# Patient Record
Sex: Female | Born: 1955 | Race: White | Hispanic: No | Smoking: Never smoker
Health system: Southern US, Community
[De-identification: ages and names within clinical notes are randomized; demographics above are authoritative.]

## PROBLEM LIST (undated history)

## (undated) DIAGNOSIS — I341 Nonrheumatic mitral (valve) prolapse: Secondary | ICD-10-CM

## (undated) DIAGNOSIS — I10 Essential (primary) hypertension: Secondary | ICD-10-CM

## (undated) HISTORY — DX: Nonrheumatic mitral (valve) prolapse: I34.1

---

## 1998-02-20 HISTORY — PX: BREAST ENHANCEMENT SURGERY: SHX7

## 1999-10-31 ENCOUNTER — Encounter: Payer: Self-pay | Admitting: Family Medicine

## 1999-10-31 ENCOUNTER — Encounter: Admission: RE | Admit: 1999-10-31 | Discharge: 1999-10-31 | Payer: Self-pay | Admitting: Family Medicine

## 2001-04-18 ENCOUNTER — Encounter: Payer: Self-pay | Admitting: *Deleted

## 2001-04-18 ENCOUNTER — Encounter: Admission: RE | Admit: 2001-04-18 | Discharge: 2001-04-18 | Payer: Self-pay | Admitting: *Deleted

## 2001-10-02 ENCOUNTER — Encounter: Payer: Self-pay | Admitting: Family Medicine

## 2001-10-02 ENCOUNTER — Encounter: Admission: RE | Admit: 2001-10-02 | Discharge: 2001-10-02 | Payer: Self-pay | Admitting: Family Medicine

## 2001-11-01 ENCOUNTER — Other Ambulatory Visit: Admission: RE | Admit: 2001-11-01 | Discharge: 2001-11-01 | Payer: Self-pay | Admitting: Obstetrics and Gynecology

## 2005-06-01 ENCOUNTER — Encounter: Admission: RE | Admit: 2005-06-01 | Discharge: 2005-06-01 | Payer: Self-pay | Admitting: Family Medicine

## 2005-06-07 ENCOUNTER — Encounter: Admission: RE | Admit: 2005-06-07 | Discharge: 2005-06-07 | Payer: Self-pay | Admitting: Family Medicine

## 2006-02-07 ENCOUNTER — Other Ambulatory Visit: Admission: RE | Admit: 2006-02-07 | Discharge: 2006-02-07 | Payer: Self-pay | Admitting: Family Medicine

## 2006-04-27 ENCOUNTER — Other Ambulatory Visit: Admission: RE | Admit: 2006-04-27 | Discharge: 2006-04-27 | Payer: Self-pay | Admitting: Obstetrics & Gynecology

## 2006-07-05 ENCOUNTER — Encounter: Admission: RE | Admit: 2006-07-05 | Discharge: 2006-07-05 | Payer: Self-pay | Admitting: Obstetrics & Gynecology

## 2008-03-13 ENCOUNTER — Encounter: Admission: RE | Admit: 2008-03-13 | Discharge: 2008-03-13 | Payer: Self-pay | Admitting: Obstetrics & Gynecology

## 2009-04-19 ENCOUNTER — Encounter: Admission: RE | Admit: 2009-04-19 | Discharge: 2009-04-19 | Payer: Self-pay | Admitting: Obstetrics & Gynecology

## 2011-06-08 ENCOUNTER — Ambulatory Visit: Payer: Self-pay | Admitting: Psychiatry

## 2012-10-31 ENCOUNTER — Ambulatory Visit: Payer: Self-pay | Admitting: Nurse Practitioner

## 2012-10-31 ENCOUNTER — Encounter: Payer: Self-pay | Admitting: Certified Nurse Midwife

## 2012-11-01 ENCOUNTER — Encounter: Payer: Self-pay | Admitting: Certified Nurse Midwife

## 2012-11-01 ENCOUNTER — Ambulatory Visit (INDEPENDENT_AMBULATORY_CARE_PROVIDER_SITE_OTHER): Payer: Self-pay | Admitting: Certified Nurse Midwife

## 2012-11-01 VITALS — BP 100/60 | HR 60 | Resp 16 | Ht 65.75 in | Wt 159.0 lb

## 2012-11-01 DIAGNOSIS — N760 Acute vaginitis: Secondary | ICD-10-CM

## 2012-11-01 DIAGNOSIS — B373 Candidiasis of vulva and vagina: Secondary | ICD-10-CM

## 2012-11-01 MED ORDER — FLUCONAZOLE 150 MG PO TABS: 150.0000 mg | ORAL_TABLET | Freq: Once | ORAL | Status: DC

## 2012-11-01 MED ORDER — METRONIDAZOLE 0.75 % VA GEL: 1.0000 | Freq: Every day | VAGINAL | Status: DC

## 2012-11-01 NOTE — Progress Notes (Signed)
57 y.o.Divorced Caucasian female G0P0000 with a 3 day(s) history of the following:burning, local irritation, odor and vulvar itching Sexually active: yes Last sexual activity:10days ago. Pt also reports the following associated symptoms: none Patient has tried over the counter treatment with no relief. Denies new personal products other than some new tights. Works out at gym, but changes clothes frequently after work out. No STD concerns. Patient menopausal on HRT, denies vaginal bleeding or vaginal dryness  O: Healthy female WDWN Affect: normal, orientation x 3    Exam:  UXL:[id number], Urethra, Skene's normal, slightly increased pink in color, no scaling or exudate or lesions                Vag:no lesions, discharge: copious, yellow, watery and slight odor, pH 5.0, wet prep done                Cx:  normal appearance and non tender                Uterus:Mid position , normal, non tender                Adnexa: normal adnexa and no mass, fullness, tenderness  Wet Prep shows:Positive for yeast and BV, negative for trich   A: BV Yeast vaginitis  P: Reviewed findings. Discussed aveeno sitz bath use for comfort. Avoid new clothing with out washing first. Rx Diflucan see order Rx Metrogel see order  RV prn

## 2012-11-03 NOTE — Progress Notes (Signed)
Note reviewed, agree with plan.  Alencia Gordon, MD  

## 2012-11-06 ENCOUNTER — Telehealth: Payer: Self-pay | Admitting: Certified Nurse Midwife

## 2012-11-06 NOTE — Telephone Encounter (Signed)
Patient returned call.  She is day 6 post treatment of 5 days of Metrogel and Diflucan x 2 (took #2 diflucan yesterday). Feels that symptoms are not improving as of today. She states they were better yesterday but today the symptoms "kicked back in". States that this has occurred previously and was rx Boric Acid pills and additional diflucan. She states she feels she let symptoms go "too far" before seeking treatment as she was using bathing suits every day this summer. Patient states she does not have insurance and is unable to come in for additional visit. Would like to find out what next steps can be taken. Please advise.  Will route to Debbi for f/u information

## 2012-11-06 NOTE — Telephone Encounter (Signed)
Left message to return call to nurse.

## 2012-11-06 NOTE — Telephone Encounter (Signed)
Patient came in for an infection on Friday  Sept. 12, 2014 and she was prescribed Metrogel and also diflucan . She is not having any improvement.

## 2012-11-07 ENCOUNTER — Other Ambulatory Visit: Payer: Self-pay | Admitting: Obstetrics and Gynecology

## 2012-11-07 NOTE — Progress Notes (Addendum)
Called to compounding pharmacy, [facility]. [address] [place], [place] [address] Phone: [phone] Fax: [phone]   Left message with information to patient.

## 2012-11-07 NOTE — Progress Notes (Signed)
[address], [place], [place] [address] [phone]

## 2012-11-07 NOTE — Progress Notes (Signed)
Patient calling in with continued vaginitis symptoms. Has been treated with Diflucan and Metrogel. Requesting Boric acid suppositories for the vagina.  No insurance.  OK for Size O gelatin caps with boric acid. Use daily for 7 days and then q 3 days for prophylaxis. Disp #14. RF zero.  If symptoms persist, needs office visit.

## 2012-11-07 NOTE — Telephone Encounter (Signed)
Spoke with patient. Message from [name].  Patient unsatisfied with message and disposition. Patient upset and feels she needs more treatment at this time. States "Just make me an appointment with Tiffany Avila". Appointment booked per her request. Patient upset she will have to pay another visit d/t lack of insurance.  Empathized with patient about frustrations and advised I would speak with a provider to obtain further disposition.

## 2012-11-07 NOTE — Telephone Encounter (Signed)
It takes one week to clear overgrowth of bacteria and yeast and another week for the ph to re balance I don't feel she has given it enough time. Patient should do Aveeno sitz bath daily to help with re balance.

## 2012-11-07 NOTE — Telephone Encounter (Signed)
Message left on phone to return call to Triage. When returns call may give message from [name]

## 2012-11-11 ENCOUNTER — Ambulatory Visit: Payer: Self-pay | Admitting: Nurse Practitioner

## 2012-11-11 NOTE — Telephone Encounter (Signed)
Boric acid pills were called in see orders only encounter dated 9/18. Patient has cancelled appointment for today.

## 2012-11-11 NOTE — Telephone Encounter (Signed)
I think appointment is the right course, if no resolution, could be another cause

## 2012-12-30 ENCOUNTER — Ambulatory Visit: Payer: Self-pay | Admitting: Nurse Practitioner

## 2013-01-22 ENCOUNTER — Telehealth: Payer: Self-pay | Admitting: Nurse Practitioner

## 2013-01-22 ENCOUNTER — Other Ambulatory Visit: Payer: Self-pay | Admitting: Obstetrics & Gynecology

## 2013-01-22 DIAGNOSIS — Z1239 Encounter for other screening for malignant neoplasm of breast: Secondary | ICD-10-CM

## 2013-01-22 NOTE — Telephone Encounter (Signed)
Patient needs an order faxed to [facility] for her Mammogram.

## 2013-01-23 NOTE — Telephone Encounter (Signed)
Order placed for screening Mammogram with implants. Unsure if patient is having issues with breast. Spoke with breast center, [facility]ia, states she is in work [place] and they will call her to schedule.

## 2013-01-24 ENCOUNTER — Other Ambulatory Visit: Payer: Self-pay | Admitting: Nurse Practitioner

## 2013-03-27 ENCOUNTER — Encounter: Payer: Self-pay | Admitting: Nurse Practitioner

## 2013-03-27 ENCOUNTER — Ambulatory Visit (INDEPENDENT_AMBULATORY_CARE_PROVIDER_SITE_OTHER): Payer: BC Managed Care – PPO | Admitting: Nurse Practitioner

## 2013-03-27 VITALS — BP 122/80 | HR 64 | Ht 66.0 in | Wt 172.0 lb

## 2013-03-27 DIAGNOSIS — Z01419 Encounter for gynecological examination (general) (routine) without abnormal findings: Secondary | ICD-10-CM

## 2013-03-27 DIAGNOSIS — Z0000 Encounter for general adult medical examination without abnormal findings: Secondary | ICD-10-CM

## 2013-03-27 LAB — POCT URINALYSIS DIPSTICK
Bilirubin, UA: NEGATIVE
Blood, UA: NEGATIVE
Glucose, UA: NEGATIVE
Ketones, UA: NEGATIVE
Leukocytes, UA: NEGATIVE
Nitrite, UA: NEGATIVE
Protein, UA: NEGATIVE
Urobilinogen, UA: NEGATIVE
pH, UA: >=9

## 2013-03-27 LAB — HEMOGLOBIN, FINGERSTICK: Hemoglobin, fingerstick: 13.3 g/dL (ref 12.0–16.0)

## 2013-03-27 MED ORDER — ESTRADIOL 0.5 MG PO TABS: 0.5000 mg | ORAL_TABLET | Freq: Every day | ORAL | Status: DC

## 2013-03-27 MED ORDER — MEDROXYPROGESTERONE ACETATE 2.5 MG PO TABS: 2.5000 mg | ORAL_TABLET | Freq: Every day | ORAL | Status: DC

## 2013-03-27 NOTE — Progress Notes (Signed)
Patient ID: Tiffany Avila, female   DOB: 26-Jan-1956, 58 y.o.   MRN: [id number] 58 y.o. G0P0 Divorced Caucasian Fe here for annual exam.  She feels well on HRT and wants to continue.  Her partner still with multiple heath issues.  Patient's last menstrual period was 02/20/2010.          Sexually active: yes  The current method of family planning is post menopausal status.    Exercising: yes  Home and gym regimen that includes walking, running and yoga 4 days per week. Smoker:  no  Health Maintenance: Pap:  04/08/10, WNL MMG:  04/20/09, Bi-Rads 1: negative Colonoscopy:  None will schedule this year BMD:  none TDaP:  2011 Labs:  13.3 Urine:  Negative, pH 9.0   reports that she has never smoked. She does not have any smokeless tobacco history on file. She reports that she does not drink alcohol or use illicit drugs.  Past Medical History  Diagnosis Date  . 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 Breast enhancement surgery  2000    Current Outpatient Prescriptions  Medication Sig Dispense Refill  . citalopram (CELEXA) 10 MG tablet Take 10 mg by mouth daily.      Marland Kitchen UNABLE TO FIND daily. Icool      . estradiol (ESTRACE) 0.5 MG tablet Take 1 tablet (0.5 mg total) by mouth daily.  90 tablet  0  . medroxyPROGESTERone (PROVERA) 2.5 MG tablet Take 1 tablet (2.5 mg total) by mouth daily.  90 tablet  0   No current facility-administered medications for this visit.    Family History  Problem Relation Age of Onset  . Lupus Mother   . Heart disease Father   . Bladder Cancer Father 39  . Breast cancer Sister 44  . Cancer - Other Brother 2    rhabnosasrcoma of inner ear    ROS:  Pertinent items are noted in HPI.  Otherwise, a comprehensive ROS was negative.  Exam:   BP 122/80  Pulse 64  Ht 5\' 6"  (1.676 m)  Wt 172 lb (78.019 kg)  BMI 27.77 kg/m2  LMP 02/20/2010 Height: 5\' 6"  (167.6 cm)  Ht Readings from  Last 3 Encounters:  03/27/13 5\' 6"  (1.676 m)  11/01/12 5' 5.75" (1.67 m)    General appearance: alert, cooperative and appears stated age Head: Normocephalic, without obvious abnormality, atraumatic Neck: no adenopathy, supple, symmetrical, trachea midline and thyroid normal to inspection and palpation Lungs: clear to auscultation bilaterally Breasts: normal appearance, no masses or tenderness Heart: regular rate and rhythm Abdomen: soft, non-tender; no masses,  no organomegaly Extremities: extremities normal, atraumatic, no cyanosis or edema Skin: Skin color, texture, turgor normal. No rashes or lesions Lymph nodes: Cervical, supraclavicular, and axillary nodes normal. No abnormal inguinal nodes palpated Neurologic: Grossly normal   Pelvic: External genitalia:  no lesions              Urethra:  normal appearing urethra with no masses, tenderness or lesions              Bartholin's and Skene's: normal                 Vagina: normal appearing vagina with normal color and discharge, no lesions              Cervix: anteverted  Pap taken: yes Bimanual Exam:  Uterus:  normal size, contour, position, consistency, mobility, non-tender              Adnexa: no mass, fullness, tenderness               Rectovaginal: Confirms               Anus:  normal sphincter tone, no lesions  A:  Well Woman with normal exam  Postmenopausal on HRT  P:   Pap smear as per guidelines  Refill on HRT X 1 but no further refills until Mammo   Counseled with potential risk DVT, CVA, cancer, etc.  Mammogram is past due and given only enough HRT until Mammo is done then will refill.  Now new health insurance and will schedule.  Counseled on breast self exam, mammography screening, adequate intake of calcium and vitamin D, diet and exercise return annually or prn  An After Visit Summary was printed and given to the patient.

## 2013-03-27 NOTE — Patient Instructions (Signed)

## 2013-03-30 NOTE — Progress Notes (Signed)
Encounter reviewed by Dr. Brook Silva.  

## 2013-04-01 LAB — IPS PAP TEST WITH HPV

## 2013-06-24 ENCOUNTER — Ambulatory Visit: Payer: BC Managed Care – PPO | Admitting: Licensed Clinical Social Worker

## 2013-10-08 ENCOUNTER — Other Ambulatory Visit: Payer: Self-pay | Admitting: Internal Medicine

## 2013-10-08 ENCOUNTER — Ambulatory Visit
Admission: RE | Admit: 2013-10-08 | Discharge: 2013-10-08 | Disposition: A | Discharge status: Home / Self Care | Payer: BC Managed Care – PPO | Source: Ambulatory Visit | Attending: Internal Medicine | Admitting: Internal Medicine

## 2013-10-08 DIAGNOSIS — G9001 Carotid sinus syncope: Secondary | ICD-10-CM

## 2013-10-08 DIAGNOSIS — M542 Cervicalgia: Secondary | ICD-10-CM

## 2013-10-13 ENCOUNTER — Ambulatory Visit
Admission: RE | Admit: 2013-10-13 | Discharge: 2013-10-13 | Disposition: A | Discharge status: Home / Self Care | Payer: BC Managed Care – PPO | Source: Ambulatory Visit | Attending: Internal Medicine | Admitting: Internal Medicine

## 2013-10-13 DIAGNOSIS — G9001 Carotid sinus syncope: Secondary | ICD-10-CM

## 2013-10-14 ENCOUNTER — Other Ambulatory Visit: Payer: Self-pay | Admitting: Internal Medicine

## 2013-10-14 DIAGNOSIS — M5412 Radiculopathy, cervical region: Secondary | ICD-10-CM

## 2013-10-21 ENCOUNTER — Other Ambulatory Visit: Payer: BC Managed Care – PPO

## 2014-03-12 ENCOUNTER — Ambulatory Visit (INDEPENDENT_AMBULATORY_CARE_PROVIDER_SITE_OTHER): Payer: 59 | Admitting: Licensed Clinical Social Worker

## 2014-03-12 DIAGNOSIS — F332 Major depressive disorder, recurrent severe without psychotic features: Secondary | ICD-10-CM

## 2014-03-19 ENCOUNTER — Ambulatory Visit (INDEPENDENT_AMBULATORY_CARE_PROVIDER_SITE_OTHER): Payer: 59 | Admitting: Licensed Clinical Social Worker

## 2014-03-19 DIAGNOSIS — F332 Major depressive disorder, recurrent severe without psychotic features: Secondary | ICD-10-CM

## 2014-03-26 ENCOUNTER — Ambulatory Visit (INDEPENDENT_AMBULATORY_CARE_PROVIDER_SITE_OTHER): Payer: 59 | Admitting: Licensed Clinical Social Worker

## 2014-03-26 DIAGNOSIS — F332 Major depressive disorder, recurrent severe without psychotic features: Secondary | ICD-10-CM

## 2014-04-09 ENCOUNTER — Ambulatory Visit (INDEPENDENT_AMBULATORY_CARE_PROVIDER_SITE_OTHER): Payer: 59 | Admitting: Licensed Clinical Social Worker

## 2014-04-09 DIAGNOSIS — F332 Major depressive disorder, recurrent severe without psychotic features: Secondary | ICD-10-CM

## 2014-04-16 ENCOUNTER — Ambulatory Visit (INDEPENDENT_AMBULATORY_CARE_PROVIDER_SITE_OTHER): Payer: 59 | Admitting: Licensed Clinical Social Worker

## 2014-04-16 DIAGNOSIS — F332 Major depressive disorder, recurrent severe without psychotic features: Secondary | ICD-10-CM

## 2014-04-21 ENCOUNTER — Ambulatory Visit (INDEPENDENT_AMBULATORY_CARE_PROVIDER_SITE_OTHER): Payer: 59 | Admitting: Licensed Clinical Social Worker

## 2014-04-21 DIAGNOSIS — F332 Major depressive disorder, recurrent severe without psychotic features: Secondary | ICD-10-CM | POA: Diagnosis not present

## 2014-04-28 ENCOUNTER — Ambulatory Visit (INDEPENDENT_AMBULATORY_CARE_PROVIDER_SITE_OTHER): Payer: 59 | Admitting: Licensed Clinical Social Worker

## 2014-04-28 DIAGNOSIS — F332 Major depressive disorder, recurrent severe without psychotic features: Secondary | ICD-10-CM | POA: Diagnosis not present

## 2014-05-05 ENCOUNTER — Ambulatory Visit (INDEPENDENT_AMBULATORY_CARE_PROVIDER_SITE_OTHER): Payer: 59 | Admitting: Licensed Clinical Social Worker

## 2014-05-05 DIAGNOSIS — F332 Major depressive disorder, recurrent severe without psychotic features: Secondary | ICD-10-CM | POA: Diagnosis not present

## 2014-05-14 ENCOUNTER — Ambulatory Visit: Payer: 59 | Admitting: Licensed Clinical Social Worker

## 2014-05-20 ENCOUNTER — Ambulatory Visit (INDEPENDENT_AMBULATORY_CARE_PROVIDER_SITE_OTHER): Payer: 59 | Admitting: Licensed Clinical Social Worker

## 2014-05-20 DIAGNOSIS — F332 Major depressive disorder, recurrent severe without psychotic features: Secondary | ICD-10-CM

## 2014-05-21 ENCOUNTER — Ambulatory Visit: Payer: 59 | Admitting: Licensed Clinical Social Worker

## 2014-05-26 ENCOUNTER — Ambulatory Visit (INDEPENDENT_AMBULATORY_CARE_PROVIDER_SITE_OTHER): Payer: 59 | Admitting: Licensed Clinical Social Worker

## 2014-05-26 DIAGNOSIS — F332 Major depressive disorder, recurrent severe without psychotic features: Secondary | ICD-10-CM

## 2014-06-02 ENCOUNTER — Ambulatory Visit: Payer: 59 | Admitting: Licensed Clinical Social Worker

## 2014-06-04 ENCOUNTER — Ambulatory Visit (INDEPENDENT_AMBULATORY_CARE_PROVIDER_SITE_OTHER): Payer: 59 | Admitting: Licensed Clinical Social Worker

## 2014-06-04 DIAGNOSIS — F332 Major depressive disorder, recurrent severe without psychotic features: Secondary | ICD-10-CM

## 2014-06-11 ENCOUNTER — Ambulatory Visit: Payer: 59 | Admitting: Licensed Clinical Social Worker

## 2014-06-13 ENCOUNTER — Other Ambulatory Visit: Payer: Self-pay | Admitting: Nurse Practitioner

## 2014-06-15 NOTE — Telephone Encounter (Signed)
Medication refill request: Estradiol 0.5 mg ; Provera 2.5 mg  Last AEX:  03/27/13 with PG  Next AEX: None scheduled  Last MMG (if hormonal medication request): 04/20/2009 Bi-Rads 1 Refill authorized: ?  Left Message To Call Back Re: Scheduling AEX

## 2014-06-15 NOTE — Telephone Encounter (Signed)
Returning call.

## 2014-06-15 NOTE — Telephone Encounter (Signed)
Left Message To Call Back  

## 2014-06-16 ENCOUNTER — Other Ambulatory Visit: Payer: Self-pay

## 2014-06-16 DIAGNOSIS — Z1231 Encounter for screening mammogram for malignant neoplasm of breast: Secondary | ICD-10-CM

## 2014-06-16 NOTE — Telephone Encounter (Signed)
MMG scheduled for 06/25/14  Please advise.

## 2014-06-16 NOTE — Telephone Encounter (Signed)
Attempted to contact patient. LM to cal back  Rx denied?  Routed to [facility]

## 2014-06-16 NOTE — Telephone Encounter (Signed)
Pt scheduled aex for 08/18/14 at 11:00. Would like Rx sent to pharmacy.

## 2014-06-18 ENCOUNTER — Ambulatory Visit (INDEPENDENT_AMBULATORY_CARE_PROVIDER_SITE_OTHER): Payer: 59 | Admitting: Licensed Clinical Social Worker

## 2014-06-18 DIAGNOSIS — F332 Major depressive disorder, recurrent severe without psychotic features: Secondary | ICD-10-CM

## 2014-06-25 ENCOUNTER — Ambulatory Visit
Admission: RE | Admit: 2014-06-25 | Discharge: 2014-06-25 | Disposition: A | Discharge status: Home / Self Care | Payer: 59 | Source: Ambulatory Visit

## 2014-06-25 ENCOUNTER — Ambulatory Visit (INDEPENDENT_AMBULATORY_CARE_PROVIDER_SITE_OTHER): Payer: 59 | Admitting: Licensed Clinical Social Worker

## 2014-06-25 DIAGNOSIS — F332 Major depressive disorder, recurrent severe without psychotic features: Secondary | ICD-10-CM

## 2014-06-25 DIAGNOSIS — Z1231 Encounter for screening mammogram for malignant neoplasm of breast: Secondary | ICD-10-CM

## 2014-07-01 ENCOUNTER — Ambulatory Visit (INDEPENDENT_AMBULATORY_CARE_PROVIDER_SITE_OTHER): Payer: 59 | Admitting: Licensed Clinical Social Worker

## 2014-07-01 DIAGNOSIS — F332 Major depressive disorder, recurrent severe without psychotic features: Secondary | ICD-10-CM | POA: Diagnosis not present

## 2014-07-08 ENCOUNTER — Ambulatory Visit: Payer: Self-pay | Admitting: Licensed Clinical Social Worker

## 2014-07-14 ENCOUNTER — Ambulatory Visit (INDEPENDENT_AMBULATORY_CARE_PROVIDER_SITE_OTHER): Payer: 59 | Admitting: Licensed Clinical Social Worker

## 2014-07-14 DIAGNOSIS — F332 Major depressive disorder, recurrent severe without psychotic features: Secondary | ICD-10-CM | POA: Diagnosis not present

## 2014-07-22 ENCOUNTER — Ambulatory Visit (INDEPENDENT_AMBULATORY_CARE_PROVIDER_SITE_OTHER): Payer: 59 | Admitting: Licensed Clinical Social Worker

## 2014-07-22 DIAGNOSIS — F332 Major depressive disorder, recurrent severe without psychotic features: Secondary | ICD-10-CM | POA: Diagnosis not present

## 2014-07-30 ENCOUNTER — Ambulatory Visit: Payer: 59 | Admitting: Licensed Clinical Social Worker

## 2014-08-06 ENCOUNTER — Ambulatory Visit (INDEPENDENT_AMBULATORY_CARE_PROVIDER_SITE_OTHER): Payer: 59 | Admitting: Licensed Clinical Social Worker

## 2014-08-06 DIAGNOSIS — F332 Major depressive disorder, recurrent severe without psychotic features: Secondary | ICD-10-CM

## 2014-08-13 ENCOUNTER — Ambulatory Visit (INDEPENDENT_AMBULATORY_CARE_PROVIDER_SITE_OTHER): Payer: 59 | Admitting: Licensed Clinical Social Worker

## 2014-08-13 DIAGNOSIS — F332 Major depressive disorder, recurrent severe without psychotic features: Secondary | ICD-10-CM | POA: Diagnosis not present

## 2014-08-18 ENCOUNTER — Ambulatory Visit (INDEPENDENT_AMBULATORY_CARE_PROVIDER_SITE_OTHER): Payer: 59 | Admitting: Nurse Practitioner

## 2014-08-18 ENCOUNTER — Encounter: Payer: Self-pay | Admitting: Nurse Practitioner

## 2014-08-18 VITALS — BP 114/72 | HR 68 | Ht 65.75 in | Wt 158.0 lb

## 2014-08-18 DIAGNOSIS — Z79890 Hormone replacement therapy (postmenopausal): Secondary | ICD-10-CM | POA: Diagnosis not present

## 2014-08-18 DIAGNOSIS — F4323 Adjustment disorder with mixed anxiety and depressed mood: Secondary | ICD-10-CM | POA: Diagnosis not present

## 2014-08-18 DIAGNOSIS — Z0000 Encounter for general adult medical examination without abnormal findings: Secondary | ICD-10-CM

## 2014-08-18 DIAGNOSIS — R8290 Unspecified abnormal findings in urine: Secondary | ICD-10-CM

## 2014-08-18 DIAGNOSIS — Z01419 Encounter for gynecological examination (general) (routine) without abnormal findings: Secondary | ICD-10-CM

## 2014-08-18 LAB — COMPREHENSIVE METABOLIC PANEL
ALT: 17 U/L (ref 0–35)
AST: 21 U/L (ref 0–37)
Albumin: 4.2 g/dL (ref 3.5–5.2)
Alkaline Phosphatase: 68 U/L (ref 39–117)
BUN: 20 mg/dL (ref 6–23)
CO2: 26 mEq/L (ref 19–32)
Calcium: 9 mg/dL (ref 8.4–10.5)
Chloride: 106 mEq/L (ref 96–112)
Creat: 0.85 mg/dL (ref 0.50–1.10)
Glucose, Bld: 58 mg/dL — ABNORMAL LOW (ref 70–99)
Potassium: 4.5 mEq/L (ref 3.5–5.3)
Sodium: 142 mEq/L (ref 135–145)
Total Bilirubin: 0.4 mg/dL (ref 0.2–1.2)
Total Protein: 6.3 g/dL (ref 6.0–8.3)

## 2014-08-18 LAB — POCT URINALYSIS DIPSTICK
Bilirubin, UA: NEGATIVE
Glucose, UA: NEGATIVE
Ketones, UA: NEGATIVE
Leukocytes, UA: NEGATIVE
Nitrite, UA: NEGATIVE
Protein, UA: NEGATIVE
Urobilinogen, UA: NEGATIVE
pH, UA: 6

## 2014-08-18 MED ORDER — ESTRADIOL 0.5 MG PO TABS: 0.5000 mg | ORAL_TABLET | Freq: Every day | ORAL | Status: AC

## 2014-08-18 MED ORDER — MEDROXYPROGESTERONE ACETATE 2.5 MG PO TABS: 2.5000 mg | ORAL_TABLET | Freq: Every day | ORAL | Status: AC

## 2014-08-18 MED ORDER — VENLAFAXINE HCL ER 37.5 MG PO CP24: 37.5000 mg | ORAL_CAPSULE | Freq: Every day | ORAL | Status: DC

## 2014-08-18 NOTE — Progress Notes (Signed)
Patient ID: Tiffany Avila, female   DOB: [birth year], 57 y.o.   MRN: [id number] 59 y.o. G0P0 Divorced  Caucasian Fe here for annual exam.  She is doing well on HRT and wants to continue.  Her father died in Mar 12, 2023 from lung cancer and she has had a difficult time dealing with this and her boyfriend who is depressed.  She is seeing Tiffany Avila and is currently on Wellbutrin.  Tiffany Avila feels she needs more of an antidepressant and treatment for anxiety.  she is having a lot of insomnia and restlessness.  Denies suicidal ideation. Work is going well.  Patient's last menstrual period was 02/20/2010 (approximate).          Sexually active: Yes.    The current method of family planning is post menopausal status.    Exercising: Yes.    weights, walking and yoga everyday Smoker:  no  Health Maintenance: Pap:  03/27/13, negative with neg HR HPV MMG:  06/26/14, Bi-Rads 1: negative Colonoscopy:  None - will do this year TDaP:  2011 Labs:  12.7   Urine:   Trace RBC   reports that she has never smoked. She has never used smokeless tobacco. She reports that she does not drink alcohol or use illicit drugs.  Past Medical History  Diagnosis Date  . 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 Breast enhancement surgery  2000    Current Outpatient Prescriptions  Medication Sig Dispense Refill  . buPROPion (WELLBUTRIN XL) 150 MG 24 hr tablet Take 1 tablet by mouth daily.    Marland Kitchen estradiol (ESTRACE) 0.5 MG tablet Take 1 tablet (0.5 mg total) by mouth daily. 90 tablet 4  . medroxyPROGESTERone (PROVERA) 2.5 MG tablet Take 1 tablet (2.5 mg total) by mouth daily. 90 tablet 4  . UNABLE TO FIND daily. Icool    . venlafaxine XR (EFFEXOR XR) 37.5 MG 24 hr capsule Take 1 capsule (37.5 mg total) by mouth daily. 90 capsule 0   No current facility-administered medications for this visit.    Family History  Problem Relation Age of Onset  . Lupus  Mother   . Heart disease Father   . Bladder Cancer Father 66  . Lung cancer Father   . Breast cancer Sister 77  . Cancer - Other Brother 2    rhabnosasrcoma of inner ear  . Hypertension Maternal Grandmother   . Hypertension Maternal Grandfather   . Hypertension Paternal Grandmother   . Hypertension Paternal Grandfather     ROS:  Pertinent items are noted in HPI.  Otherwise, a comprehensive ROS was negative.  Exam:   BP 114/72 mmHg  Pulse 68  Ht 5' 5.75" (1.67 m)  Wt 158 lb (71.668 kg)  BMI 25.70 kg/m2  LMP 02/20/2010 (Approximate) Height: 5' 5.75" (167 cm) Ht Readings from Last 3 Encounters:  08/18/14 5' 5.75" (1.67 m)  03/27/13 5\' 6"  (1.676 m)  11/01/12 5' 5.75" (1.67 m)    General appearance: alert, cooperative and appears stated age Head: Normocephalic, without obvious abnormality, atraumatic Neck: no adenopathy, supple, symmetrical, trachea midline and thyroid normal to inspection and palpation Lungs: clear to auscultation bilaterally Breasts: normal appearance, no masses or tenderness, positive findings: implants bilaterally Heart: regular rate and rhythm Abdomen: soft, non-tender; no masses,  no organomegaly Extremities: extremities normal, atraumatic, no cyanosis or edema Skin: Skin color, texture, turgor normal. No rashes or lesions Lymph nodes: Cervical,  supraclavicular, and axillary nodes normal. No abnormal inguinal nodes palpated Neurologic: Grossly normal   Pelvic: External genitalia:  no lesions              Urethra:  normal appearing urethra with no masses, tenderness or lesions              Bartholin's and Skene's: normal                 Vagina: normal appearing vagina with normal color and discharge, no lesions              Cervix: anteverted              Pap taken: No. Bimanual Exam:  Uterus:  normal size, contour, position, consistency, mobility, non-tender              Adnexa: no mass, fullness, tenderness               Rectovaginal: Confirms                Anus:  normal sphincter tone, no lesions  Chaperone present:  no  A:  Well Woman with normal exam  Postmenopausal on HRT since 12/26/2011  Situational anxiety and depression  Grief reaction  S/P Breast implants   P:   Reviewed health and wellness pertinent to exam  Pap smear as above  Mammogram is due 06/2015  Refill of Estrace and Provera for a year  Counseled on risk of DVT, CVA, cancer, etc  Will start on Effexor 37.5 mg daily and plan to see her back in 3 months.  If she feels med's are not helping enough to call back in the interim and may need to go up to 75 mg daily.  She is to continue with counseling and if symptoms of depression worsens to let us know.    Continue with Wellbutrin at this time.- does not need a refill now.  Will follow with CMP (because of starting Effexor) and urine  Counseled on breast self exam, mammography screening, use and side effects of HRT, adequate intake of calcium and vitamin D, diet and exercise return annually or prn  An After Visit Summary was printed and given to the patient.  Her parents were patients of mine at [facility].

## 2014-08-18 NOTE — Patient Instructions (Signed)

## 2014-08-18 NOTE — Progress Notes (Signed)
Encounter reviewed by Dr. Aundria Rud. Will review medical options for treatment of depression and anxiety for patient.

## 2014-08-19 LAB — URINALYSIS, MICROSCOPIC ONLY
Bacteria, UA: NONE SEEN
Casts: NONE SEEN
Crystals: NONE SEEN
Squamous Epithelial / LPF: NONE SEEN

## 2014-08-20 ENCOUNTER — Telehealth: Payer: Self-pay | Admitting: Obstetrics & Gynecology

## 2014-08-20 ENCOUNTER — Ambulatory Visit (INDEPENDENT_AMBULATORY_CARE_PROVIDER_SITE_OTHER): Payer: 59 | Admitting: Licensed Clinical Social Worker

## 2014-08-20 ENCOUNTER — Ambulatory Visit (INDEPENDENT_AMBULATORY_CARE_PROVIDER_SITE_OTHER): Payer: 59

## 2014-08-20 ENCOUNTER — Ambulatory Visit (INDEPENDENT_AMBULATORY_CARE_PROVIDER_SITE_OTHER): Payer: 59 | Admitting: Obstetrics & Gynecology

## 2014-08-20 ENCOUNTER — Encounter: Payer: Self-pay | Admitting: Nurse Practitioner

## 2014-08-20 ENCOUNTER — Ambulatory Visit (INDEPENDENT_AMBULATORY_CARE_PROVIDER_SITE_OTHER): Payer: 59 | Admitting: Nurse Practitioner

## 2014-08-20 ENCOUNTER — Telehealth: Payer: Self-pay | Admitting: Nurse Practitioner

## 2014-08-20 VITALS — BP 120/82 | HR 72 | Resp 18 | Ht 65.75 in | Wt 156.0 lb

## 2014-08-20 VITALS — BP 118/76 | HR 64 | Ht 65.75 in | Wt 156.0 lb

## 2014-08-20 DIAGNOSIS — R1032 Left lower quadrant pain: Secondary | ICD-10-CM

## 2014-08-20 DIAGNOSIS — N760 Acute vaginitis: Secondary | ICD-10-CM | POA: Diagnosis not present

## 2014-08-20 DIAGNOSIS — F332 Major depressive disorder, recurrent severe without psychotic features: Secondary | ICD-10-CM

## 2014-08-20 LAB — CBC WITH DIFFERENTIAL/PLATELET
Basophils Absolute: 0.1 10*3/uL (ref 0.0–0.1)
Basophils Relative: 1 % (ref 0–1)
Eosinophils Absolute: 0.1 10*3/uL (ref 0.0–0.7)
Eosinophils Relative: 1 % (ref 0–5)
HCT: 40.6 % (ref 36.0–46.0)
Hemoglobin: 13.3 g/dL (ref 12.0–15.0)
Lymphocytes Relative: 24 % (ref 12–46)
Lymphs Abs: 1.3 10*3/uL (ref 0.7–4.0)
MCH: 30.4 pg (ref 26.0–34.0)
MCHC: 32.8 g/dL (ref 30.0–36.0)
MCV: 92.7 fL (ref 78.0–100.0)
MPV: 8.8 fL (ref 8.6–12.4)
Monocytes Absolute: 0.5 10*3/uL (ref 0.1–1.0)
Monocytes Relative: 10 % (ref 3–12)
Neutro Abs: 3.4 10*3/uL (ref 1.7–7.7)
Neutrophils Relative %: 64 % (ref 43–77)
Platelets: 235 10*3/uL (ref 150–400)
RBC: 4.38 MIL/uL (ref 3.87–5.11)
RDW: 13 % (ref 11.5–15.5)
WBC: 5.3 10*3/uL (ref 4.0–10.5)

## 2014-08-20 LAB — URINE CULTURE

## 2014-08-20 NOTE — Telephone Encounter (Signed)
Patient came in for appointment early. Discussed benefits. Pt agreeable. Ok to close

## 2014-08-20 NOTE — Progress Notes (Signed)
Patient scheduled for CT Abdomen Pelvis With contrast for 08/21/14 at 0930 at [facility]. To arrive at 0730 to start oral contrast. NPO after midnight. Authorization number [id number]  Detailed message left to advise with instructions.

## 2014-08-20 NOTE — Patient Instructions (Signed)
Will return today at 2:50 for PUS at 3:00 pm.

## 2014-08-20 NOTE — Progress Notes (Signed)
Subjective:     Patient ID: Tiffany Avila, female   DOB: May 24, 1955, 59 y.o.   MRN: [id number]  HPI  This 59 yo DW Female with complaints of a sudden onset of 'prickly' dull and achy pain in left lower abdomen that radiates through to her back since Monday PM.  She was here for AEX  On  Tuesday but failed to mention.    Pain has gotten worse since then.   Some relief with BM but not always.  Denies bloating, flatus, changes in stool. Constipation, diarrhea.  Denies upper Gi symptoms and GERD.  She also denies urinary symptoms and urine culture done on Tuesday was negative other than vaginal contamination.  She had no RBC on urine micro.  She has remote history of OV cyst.  These symptoms seem to be menstrual type..  No fever or chills.  She has no history of trauma.  She has been doing some research and is quite concerned about OV disease/ cancer.  She is postmenopausal since 02/20/2010 and is on HRT since 12/26/2011.  No vaginal bleeding.      Review of Systems  Constitutional: Negative for fever, chills, diaphoresis and fatigue.  HENT: Negative.   Respiratory: Negative.   Cardiovascular: Negative.   Gastrointestinal: Positive for abdominal pain. Negative for nausea, vomiting, diarrhea, constipation, blood in stool, abdominal distention, anal bleeding and rectal pain.  Endocrine: Negative.   Genitourinary: Positive for hematuria and pelvic pain. Negative for dysuria, urgency, frequency, flank pain, decreased urine volume, vaginal bleeding, vaginal discharge, enuresis, genital sores, vaginal pain, menstrual problem and dyspareunia.  Musculoskeletal: Positive for back pain. Negative for myalgias, joint swelling, gait problem, neck pain and neck stiffness.  Skin: Negative.   Neurological: Positive for tremors. Negative for dizziness, seizures, syncope, speech difficulty, light-headedness, numbness and headaches.  Hematological: Negative.   Psychiatric/Behavioral: Positive for dysphoric mood. Negative  for suicidal ideas, behavioral problems, confusion, sleep disturbance and self-injury. The patient is nervous/anxious.        Objective:   Physical Exam  Constitutional: She is oriented to person, place, and time. She appears well-developed and well-nourished. No distress.  Cardiovascular: Normal rate.   Pulmonary/Chest: Effort normal.  Abdominal: Soft. She exhibits no distension and no mass. There is tenderness. There is no rebound and no guarding.  Genitourinary:  Normal vaginal discharge -Affirm test is done since urine looked like vaginal contamination from Tuesday.  Normal pressure on bimanual exam but seemed to have more pain on the left. No obvious mass.  Musculoskeletal: Normal range of motion.  Neurological: She is alert and oriented to person, place, and time.  Psychiatric: She has a normal mood and affect. Her behavior is normal. Judgment and thought content normal.       Assessment:     Left lower abdominal pain ? Etiology Pt. Extremely concerned about OV cancer Postmenopausal on HRT since 2013 Situational anxiety and depression - grief reaction R/O BV as the cause of abnormal urine from Tuesday    Plan:     I have checked to see if possible PUS today vs. at [facility] and we can work in today Will follow up with Affirm We also discussed Dr. Elza Rafter concern about seizure risk with Effexor and Welbutrin.  She has not yet started the medication but is aware of potential side effect - and the plan was to decrease  Wellbutrin once Effexor was in her system.  Also reviewed labs from Tuesday.

## 2014-08-20 NOTE — Telephone Encounter (Signed)
Patient called and said, "I have been having back cramps and pelvic pain that shoots down into my legs. I did not discuss this with Patty when I came in 08/18/14 and it has gotten worse. I was up all night on [employer] and am now pretty worried. I'd like to come in for an appointment."

## 2014-08-20 NOTE — Telephone Encounter (Signed)
Call to patient. She states on Monday she began to have L sided back pain, describes as "it feels heavy." Was seen for office visit on Tuesday with Milford Cage, FNP. Patient states she has been on the internet and reasearching her symptoms and now has fears about ovarian cancer. Office visit scheduled for patient today per her request for 1115 with Milford Cage, FNP.   Routing to provider for final review. Patient agreeable to disposition. Will close encounter.

## 2014-08-20 NOTE — Telephone Encounter (Signed)
Called patient to review benefits for procedure. Left voicemail to call back and review. °

## 2014-08-20 NOTE — Progress Notes (Signed)
Subjective:     Patient ID: Tiffany Avila, female   DOB: [birth year], 59 y.o.   MRN: [id number]  HPI 59 yo G0 DWF here for PUS to evaluate LLQ pain that has graduallly worsened over last three days.  Pt notes in LLQ and reports radiation through to her back.  This started Monday.  Pt was seen for AEX on Tues with Kem Boroughs.  She was having pain then but was mild and she didn't mention it.  Has gradually increased and now worried about the pain.    She's never had a colonoscopy.  Denies changes in bowel movement.  No bladder changes either.  Urine micro and culture were negative on 08/18/14.  No nausea.  No fever.     Review of Systems  All other systems reviewed and are negative.      Objective:   Physical Exam  Constitutional: She is oriented to person, place, and time. She appears well-developed and well-nourished.  Cardiovascular: Normal rate and regular rhythm.   Pulmonary/Chest: Effort normal and breath sounds normal.  Abdominal: Soft. Bowel sounds are normal. She exhibits no mass. There is tenderness (in LLQ). There is no rebound and no guarding.  Genitourinary: Vagina normal. There is no rash, tenderness or lesion on the right labia. There is no rash, tenderness or lesion on the left labia. Uterus is not deviated, not enlarged, not fixed and not tender. Cervix exhibits no motion tenderness. Right adnexum displays no mass, no tenderness and no fullness. Left adnexum displays no mass, no tenderness and no fullness.  Pain is lateral to left ovary.  Lymphadenopathy:       Right: No inguinal adenopathy present.       Left: No inguinal adenopathy present.  Neurological: She is alert and oriented to person, place, and time.  Skin: Skin is warm and dry.  Psychiatric: She has a normal mood and affect.   Ultrasound performed today in office. Uterus 6.0 x 3.2 x 2.0cm with possible nabothian cyst with internal echoes and internal peripheral vascular flow. Endometrium 1.34mm Left ovary 2.3  x 1.0 x 1.0cm Right ovary 2.2 x 1.6 x 1.0cm Cul de sac without any free fluid  Findings reviewed with pt.    Assessment:     LLQ pain Vascular appearing nabothian cyst     Plan:     CBC with diff now.  Had normal CMP this week.  Plan CT abd/pelvis with PO and IV contrast to r/o diverticular disease.     ~25 minutes spent with pt, >50% of time was in face to face discussion of ultrasound findings and additional recommendations.

## 2014-08-21 ENCOUNTER — Telehealth: Payer: Self-pay | Admitting: Obstetrics & Gynecology

## 2014-08-21 ENCOUNTER — Ambulatory Visit (HOSPITAL_BASED_OUTPATIENT_CLINIC_OR_DEPARTMENT_OTHER)
Admission: RE | Admit: 2014-08-21 | Discharge: 2014-08-21 | Disposition: A | Discharge status: Home / Self Care | Payer: 59 | Source: Ambulatory Visit | Attending: Obstetrics & Gynecology | Admitting: Obstetrics & Gynecology

## 2014-08-21 ENCOUNTER — Encounter (HOSPITAL_BASED_OUTPATIENT_CLINIC_OR_DEPARTMENT_OTHER): Payer: Self-pay

## 2014-08-21 DIAGNOSIS — R1032 Left lower quadrant pain: Secondary | ICD-10-CM | POA: Insufficient documentation

## 2014-08-21 LAB — WET PREP BY MOLECULAR PROBE
Candida species: NEGATIVE
Gardnerella vaginalis: NEGATIVE
Trichomonas vaginosis: NEGATIVE

## 2014-08-21 MED ORDER — IOHEXOL 300 MG/ML  SOLN
100.0000 mL | Freq: Once | INTRAMUSCULAR | Status: AC | PRN
  Administered 2014-08-21: 100 mL via INTRAVENOUS

## 2014-08-21 NOTE — Telephone Encounter (Signed)
Spoke with patient. Advised of results as seen below from [place]. Patient is agreeable and verbalizes understanding. Patient states she has a GI provider in [place] that she would like to be seen with. Patient would like to find out more information regarding the practice and schedule an appointment. Will call if she needs a referral to that office or to an office we recommend.  Notes Recorded by Megan Salon, MD on 08/21/2014 at 3:12 PM Inform pt CBC is neg.  Notes Recorded by Megan Salon, MD on 08/21/2014 at 3:13 PM Inform CT is completely negative except has some mild degenerative changes L5-S1. I would like for her to have a colonoscopy and referral to GI.  Routing to provider for final review. Patient agreeable to disposition. Will close encounter.   Patient aware provider will review message and nurse will return call if any additional advice or change of disposition.

## 2014-08-21 NOTE — Telephone Encounter (Signed)
Patient calling to check on the status of her CT scan done this morning

## 2014-08-25 LAB — HEMOGLOBIN, FINGERSTICK: Hemoglobin, fingerstick: 12.7 g/dL (ref 12.0–16.0)

## 2014-08-25 NOTE — Telephone Encounter (Signed)
Reminder placed to ensure that I received the colonoscopy report.

## 2014-08-25 NOTE — Progress Notes (Signed)
Reviewed personally.  M. Suzanne Tallon Gertz, MD.  

## 2014-08-26 ENCOUNTER — Other Ambulatory Visit: Payer: 59

## 2014-08-27 ENCOUNTER — Encounter: Payer: Self-pay | Admitting: Obstetrics & Gynecology

## 2014-08-27 ENCOUNTER — Ambulatory Visit: Payer: 59 | Admitting: Licensed Clinical Social Worker

## 2014-08-28 ENCOUNTER — Telehealth: Payer: Self-pay | Admitting: Emergency Medicine

## 2014-08-28 DIAGNOSIS — R1032 Left lower quadrant pain: Secondary | ICD-10-CM

## 2014-08-28 NOTE — Telephone Encounter (Signed)
Pt returned call. She states she is feeling improved, but has stomach pains with recently eating flax seeds. Patient has not tried to make appointment with GI that her friend recommended in [place], patient requests that we just make referral to stay in [place].  No fevers or stool changes.  Advised patient will refer to Dr. Collene Mares for evaluation and treatment per Dr. Sabra Heck. Patient agreeable to this.   Referral faxed at this time to Dr. Collene Mares. Will await appointment confirmation.

## 2014-08-28 NOTE — Telephone Encounter (Signed)
Thank you for calling pt.  OK to close encounter.

## 2014-08-28 NOTE — Telephone Encounter (Signed)
Message left to return call to Tiffany Avila at [phone].    

## 2014-08-28 NOTE — Telephone Encounter (Signed)
-----   Message from Megan Salon, MD sent at 08/27/2014  3:06 PM EDT ----- Regarding: follow up Pt had neg CT last week.  I wanted her to follow up with GI for colonoscopy.  Can you call and get an update.  Just wondering if she is still having pain and did she schedule follow up?  Thanks.  MSM

## 2014-08-31 ENCOUNTER — Other Ambulatory Visit (INDEPENDENT_AMBULATORY_CARE_PROVIDER_SITE_OTHER): Payer: 59

## 2014-08-31 DIAGNOSIS — Z0000 Encounter for general adult medical examination without abnormal findings: Secondary | ICD-10-CM

## 2014-08-31 LAB — HEMOGLOBIN A1C
Hgb A1c MFr Bld: 5.4 % (ref <5.7)
Mean Plasma Glucose: 108 mg/dL (ref <117)

## 2014-08-31 LAB — LIPID PANEL
Cholesterol: 197 mg/dL (ref 0–200)
HDL: 63 mg/dL (ref >=46)
LDL Cholesterol: 121 mg/dL — ABNORMAL HIGH (ref 0–99)
Total CHOL/HDL Ratio: 3.1 Ratio
Triglycerides: 64 mg/dL (ref <150)
VLDL: 13 mg/dL (ref 0–40)

## 2014-08-31 LAB — TSH: TSH: 3.698 u[IU]/mL (ref 0.350–4.500)

## 2014-09-01 ENCOUNTER — Ambulatory Visit: Payer: Self-pay | Admitting: Licensed Clinical Social Worker

## 2014-09-01 LAB — VITAMIN D 25 HYDROXY (VIT D DEFICIENCY, FRACTURES): Vit D, 25-Hydroxy: 36 ng/mL (ref 30–100)

## 2014-09-02 ENCOUNTER — Telehealth: Payer: Self-pay | Admitting: *Deleted

## 2014-09-02 NOTE — Telephone Encounter (Signed)
-----   Message from Megan Salon, MD sent at 08/27/2014  3:06 PM EDT ----- Regarding: follow up Pt had neg CT last week.  I wanted her to follow up with GI for colonoscopy.  Can you call and get an update.  Just wondering if she is still having pain and did she schedule follow up?  Thanks.  MSM

## 2014-09-02 NOTE — Telephone Encounter (Signed)
Call to patient, left message to call back with update. Voice mail confirms name. Patient can speak to triage.

## 2014-09-07 ENCOUNTER — Telehealth: Payer: Self-pay | Admitting: *Deleted

## 2014-09-07 NOTE — Telephone Encounter (Signed)
-----   Message from Kem Boroughs, [place] sent at 09/01/2014  8:10 AM EDT ----- Please let pt know that Vit D is normal at 36, Lipid panel is normal except for elevated LDL at 121 - this can come down with a low cholesterol diet.  TSH is normal, HGB AIC is normal.  Is she feeling better?

## 2014-09-07 NOTE — Telephone Encounter (Signed)
I have attempted to contact this patient by phone with the following results: left message to return call to [place] at [phone] on answering machine (mobile per [facility]).  Name verified on voicemail, advised call was regarding labs.  [phone] (Mobile) *Preferred*

## 2014-09-08 NOTE — Telephone Encounter (Signed)
Pt notified in result note.  Closing encounter. 

## 2014-09-10 NOTE — Telephone Encounter (Signed)
Dr Youlanda Mighty office waiting for pt to return call for scheduling. Attempted call to patient with details. Left voicemail to return call.

## 2014-09-11 NOTE — Telephone Encounter (Signed)
Lattie Haw called from Dr. Lorie Apley office about this patient's insurance requiring a prior authorization. We do not have a PCP on file so Lattie Haw is reaching out to the patient for this information. FYI only.

## 2014-09-16 NOTE — Telephone Encounter (Signed)
Called Tiffany Avila at Dr. Lorie Apley office for update. She states she reached out to patient yesterday to schedule appointment. She will wait for patient to return call.

## 2014-09-17 ENCOUNTER — Ambulatory Visit: Payer: 59 | Admitting: Licensed Clinical Social Worker

## 2014-09-22 NOTE — Telephone Encounter (Signed)
Call to patient to review status of appointment scheduling. Left voicemail to return call.

## 2014-09-28 ENCOUNTER — Ambulatory Visit (INDEPENDENT_AMBULATORY_CARE_PROVIDER_SITE_OTHER): Payer: 59 | Admitting: Licensed Clinical Social Worker

## 2014-09-28 DIAGNOSIS — F332 Major depressive disorder, recurrent severe without psychotic features: Secondary | ICD-10-CM | POA: Diagnosis not present

## 2014-09-29 ENCOUNTER — Telehealth: Payer: Self-pay

## 2014-09-29 NOTE — Telephone Encounter (Signed)
Lmtcb//kn 

## 2014-09-29 NOTE — Telephone Encounter (Signed)
-----   Message from Megan Salon, MD sent at 09/29/2014  4:45 AM EDT ----- Regarding: colonoscopy report Can you call pt and check on her.  She had CT scan in early July for pain.  This was negative.  Ultrasound was negative.  She was going to go for a colonoscopy and I don't have a report about this.  Can you see if she has gone?  Thanks.  MSM

## 2014-10-01 NOTE — Telephone Encounter (Signed)
Several attempts to reach patient. Dr Lorie Apley office has also tried to contact her.  See next phone note, Claiborne Billings called patient again on 09-29-14 for update and had to leave message.  Routing to provider and will close this encounter. See next phone call.

## 2014-10-05 ENCOUNTER — Encounter: Payer: Self-pay | Admitting: Obstetrics & Gynecology

## 2014-10-05 NOTE — Telephone Encounter (Signed)
Personally called pt as well to see if she can call us with an update and I asked her to call Dr. Lorie Apley office to scheduled a colonoscopy.  If no return call, will send certified letter.

## 2014-10-06 ENCOUNTER — Ambulatory Visit (INDEPENDENT_AMBULATORY_CARE_PROVIDER_SITE_OTHER): Payer: 59 | Admitting: Licensed Clinical Social Worker

## 2014-10-06 DIAGNOSIS — F332 Major depressive disorder, recurrent severe without psychotic features: Secondary | ICD-10-CM

## 2014-10-07 NOTE — Telephone Encounter (Signed)
Contacted Dr Lorie Apley office to check status - they state they have attempted 4 phone calls since July, the last one being 10/02/14 and left voicemails. Ms Ferrando also needs a referral from her pcp based on insurance. I attempted a call to [phone] and left voicemail. If patient returns call and Jacqlyn Larsen is not available, please notify patient Dr Lorie Apley office is attempting to schedule and she may call their office at [phone]. Also, she needs a referral from her primary care due to insurance reasons. She may let us know her pcp or tell Dr Lorie Apley office when she calls to schedule.  Routing to [employer] and Dr Sabra Heck as Juluis Rainier

## 2014-10-07 NOTE — Telephone Encounter (Signed)
Spoke with patient. 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 She states she has the information for Dr Collene Mares and will call them to schedule towards the end of the year.

## 2014-10-08 NOTE — Telephone Encounter (Signed)
See additional phone note. Patient spoke to [facility], referral coordinator on 10-07-14. Patient declines to scheduled at this time. See next note. Do you want to send letter?

## 2014-10-11 NOTE — Telephone Encounter (Signed)
Pt called back.  Documented in additional phone note.  States she will schedule follow up with Dr. Collene Mares but not at this time.  Ok to close encounter and remove from referral work queue, if needed.  Routing to [employer] and [employer].

## 2014-10-12 NOTE — Telephone Encounter (Signed)
Per dr Sabra Heck ok to close encounter. Noted in referral

## 2014-10-12 NOTE — Telephone Encounter (Signed)
Dr Sabra Heck, is there anything I need to do for this patient? Please advise.//kn

## 2014-10-13 ENCOUNTER — Ambulatory Visit (INDEPENDENT_AMBULATORY_CARE_PROVIDER_SITE_OTHER): Payer: 59 | Admitting: Licensed Clinical Social Worker

## 2014-10-13 DIAGNOSIS — F332 Major depressive disorder, recurrent severe without psychotic features: Secondary | ICD-10-CM

## 2014-10-22 ENCOUNTER — Ambulatory Visit (INDEPENDENT_AMBULATORY_CARE_PROVIDER_SITE_OTHER): Payer: 59 | Admitting: Licensed Clinical Social Worker

## 2014-10-22 DIAGNOSIS — F332 Major depressive disorder, recurrent severe without psychotic features: Secondary | ICD-10-CM

## 2014-11-05 ENCOUNTER — Ambulatory Visit (INDEPENDENT_AMBULATORY_CARE_PROVIDER_SITE_OTHER): Payer: 59 | Admitting: Licensed Clinical Social Worker

## 2014-11-05 DIAGNOSIS — F332 Major depressive disorder, recurrent severe without psychotic features: Secondary | ICD-10-CM

## 2014-11-12 ENCOUNTER — Ambulatory Visit (INDEPENDENT_AMBULATORY_CARE_PROVIDER_SITE_OTHER): Payer: 59 | Admitting: Licensed Clinical Social Worker

## 2014-11-12 DIAGNOSIS — F332 Major depressive disorder, recurrent severe without psychotic features: Secondary | ICD-10-CM | POA: Diagnosis not present

## 2014-11-26 ENCOUNTER — Ambulatory Visit: Payer: 59 | Admitting: Licensed Clinical Social Worker

## 2014-12-08 ENCOUNTER — Ambulatory Visit: Payer: 59 | Admitting: Licensed Clinical Social Worker

## 2014-12-16 ENCOUNTER — Ambulatory Visit: Payer: 59 | Admitting: Licensed Clinical Social Worker

## 2014-12-28 ENCOUNTER — Ambulatory Visit (INDEPENDENT_AMBULATORY_CARE_PROVIDER_SITE_OTHER): Payer: 59 | Admitting: Licensed Clinical Social Worker

## 2014-12-28 DIAGNOSIS — F332 Major depressive disorder, recurrent severe without psychotic features: Secondary | ICD-10-CM

## 2014-12-31 ENCOUNTER — Other Ambulatory Visit: Payer: Self-pay | Admitting: *Deleted

## 2014-12-31 NOTE — Telephone Encounter (Signed)
Medication refill request: Effexor  Last AEX:  08/18/14 PG Next AEX: 08/20/15 PG Last MMG (if hormonal medication request): 06/26/14 BIRADS1:neg Refill authorized: 08/18/14 #90caps/ 0R. Today please advise.

## 2015-01-01 MED ORDER — VENLAFAXINE HCL ER 37.5 MG PO CP24: 37.5000 mg | ORAL_CAPSULE | Freq: Every day | ORAL | Status: DC

## 2015-01-07 ENCOUNTER — Telehealth: Payer: Self-pay | Admitting: *Deleted

## 2015-01-07 NOTE — Telephone Encounter (Addendum)
Called patient re: refills request from [place].  Rx was sent to [facility] 01/01/15 #90caps/3R. [facility] is not in her pharmacy list. If she needs Rx to go to [facility] she needs to [facility]

## 2015-01-11 MED ORDER — VENLAFAXINE HCL ER 37.5 MG PO CP24: 37.5000 mg | ORAL_CAPSULE | Freq: Every day | ORAL | Status: AC

## 2015-01-11 NOTE — Telephone Encounter (Signed)
Medication refill request: Effexor  Last AEX:  07/29/14 PG Next AEX: None Last MMG (if hormonal medication request): 06/26/14 BIRADS1:neg Refill authorized: 01/01/15 #90caps/3R.   Patient called back/. States insurance only works with [employer]. [facility] as [facility] 01/01/15 by PG.

## 2015-01-11 NOTE — Telephone Encounter (Signed)
Third attempt to contact pt. Left voicemail to call back.

## 2015-01-12 ENCOUNTER — Ambulatory Visit (INDEPENDENT_AMBULATORY_CARE_PROVIDER_SITE_OTHER): Payer: 59 | Admitting: Licensed Clinical Social Worker

## 2015-01-12 DIAGNOSIS — F332 Major depressive disorder, recurrent severe without psychotic features: Secondary | ICD-10-CM

## 2015-01-27 ENCOUNTER — Ambulatory Visit: Payer: 59 | Admitting: Licensed Clinical Social Worker

## 2015-02-17 ENCOUNTER — Ambulatory Visit: Payer: 59 | Admitting: Licensed Clinical Social Worker

## 2015-04-16 ENCOUNTER — Ambulatory Visit: Payer: 59 | Admitting: Licensed Clinical Social Worker

## 2015-07-20 ENCOUNTER — Ambulatory Visit: Payer: 59 | Admitting: Licensed Clinical Social Worker

## 2015-08-20 ENCOUNTER — Ambulatory Visit: Payer: 59 | Admitting: Nurse Practitioner

## 2015-08-20 ENCOUNTER — Encounter: Payer: Self-pay | Admitting: Nurse Practitioner

## 2016-09-05 IMAGING — MG MM DIGITAL SCREENING IMPLANTS W/ CAD
8 series · 8 of 8 positions shown · non-contrast
Comparison: Previous exam(s).

CLINICAL DATA: Screening.

EXAM:
DIGITAL SCREENING BILATERAL MAMMOGRAM WITH IMPLANTS AND CAD

[R CC]
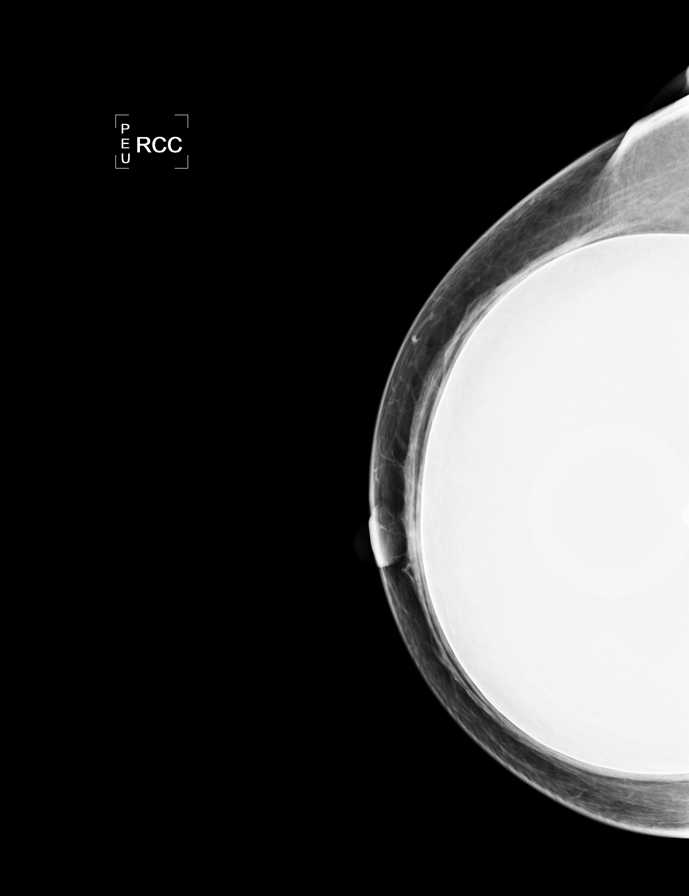

[L CC]
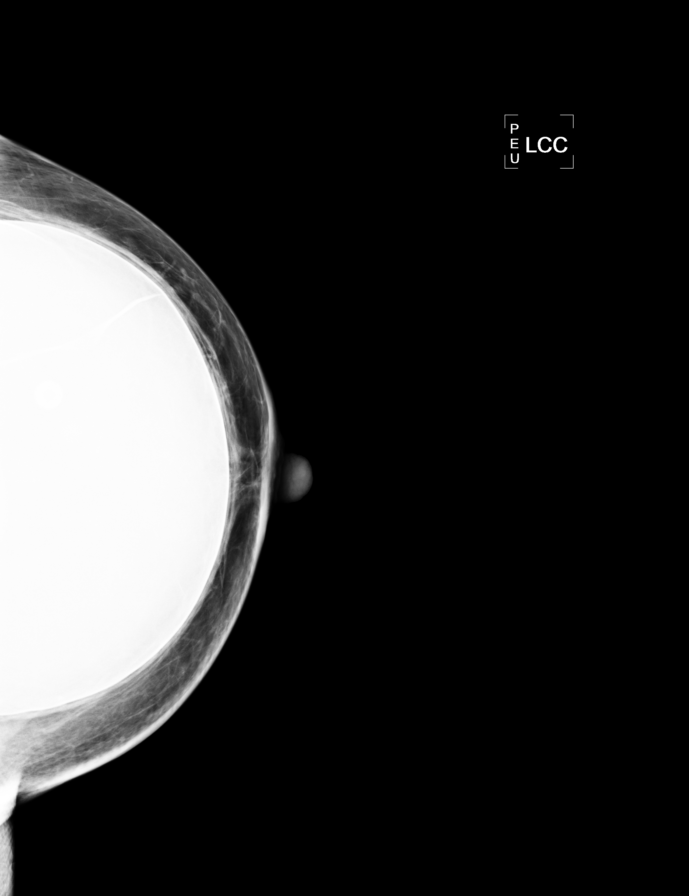

[L MLO]
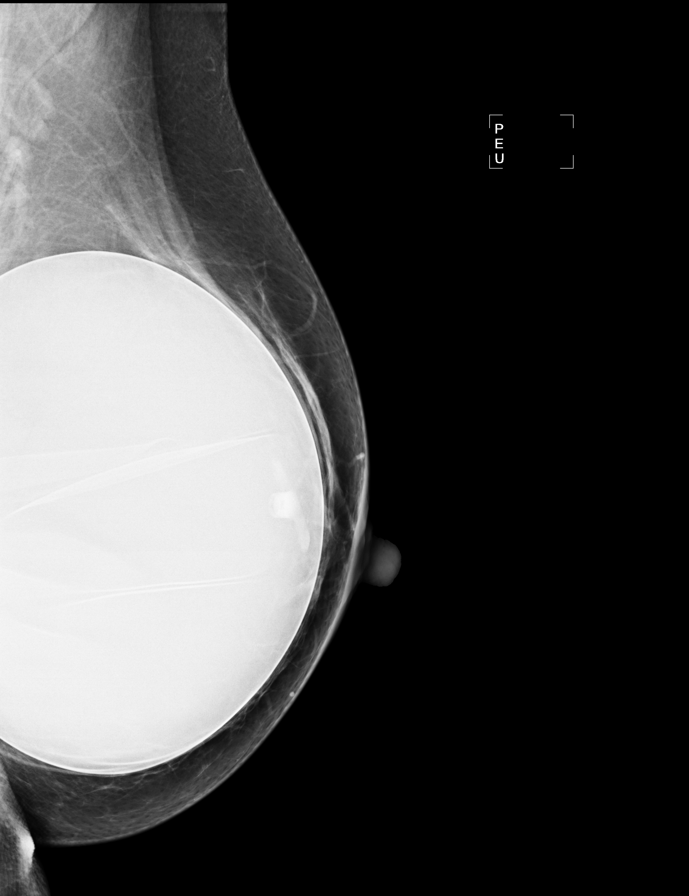

[R MLO]
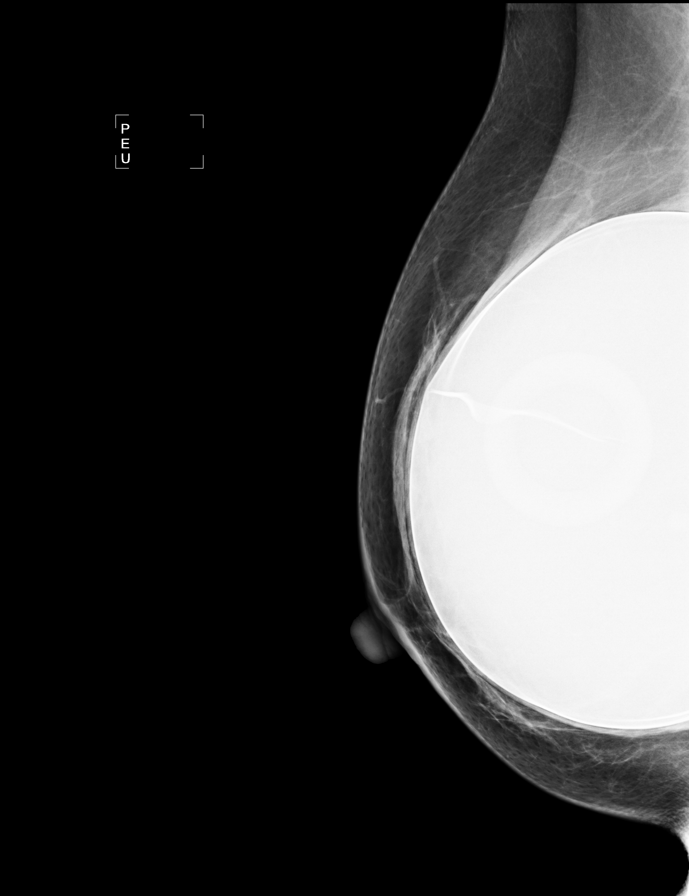

[R CCID]
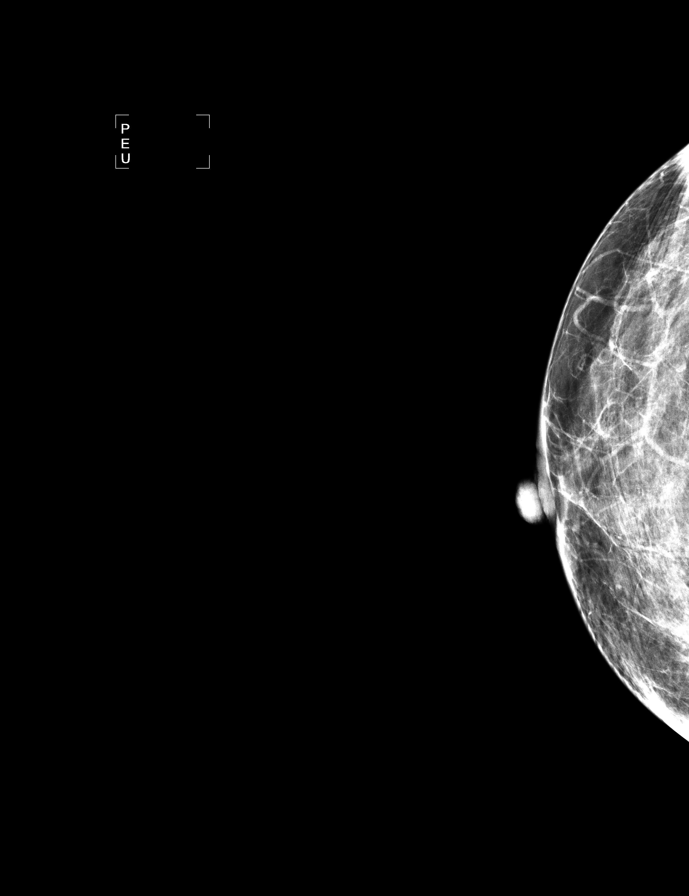

[L CCID]
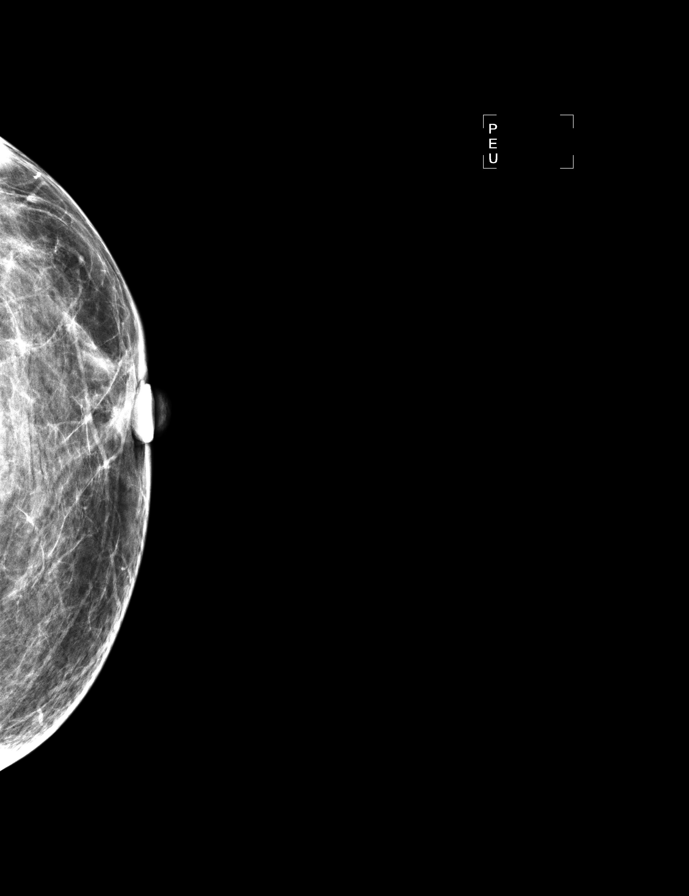

[L MLOID]
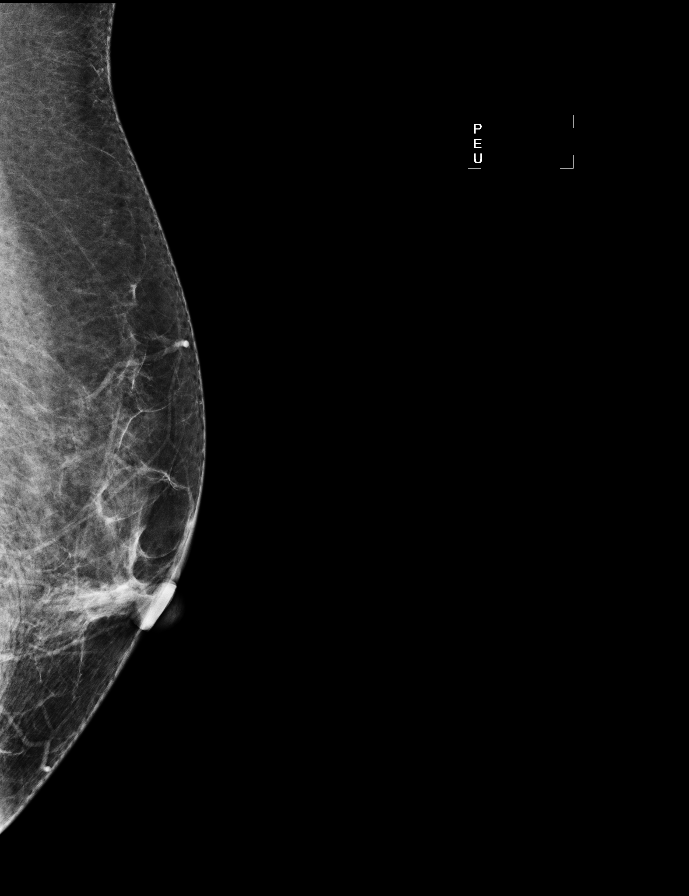

[R MLOID]
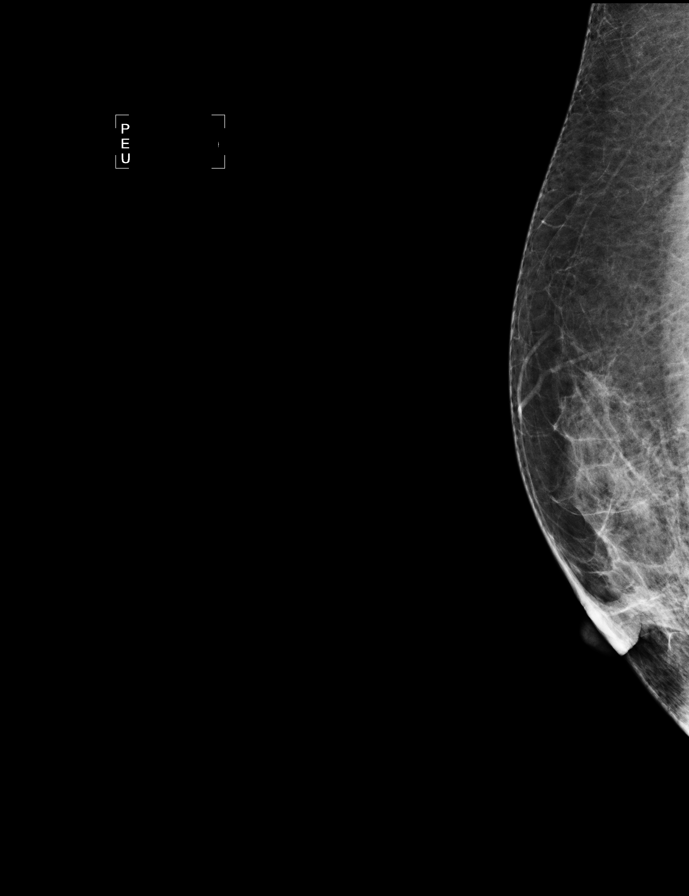

[8 of 8 positions shown; findings below may reference images not displayed]

ACR Breast Density Category b: There are scattered areas of
fibroglandular density.
FINDINGS: There are no findings suspicious for malignancy. The patient has
bilateral retropectoral saline implants. Images were processed with
CAD.
IMPRESSION: No mammographic evidence of malignancy. A result letter of this
screening mammogram will be mailed directly to the patient.

RECOMMENDATION:
Screening mammogram in one year. (Code:[id number])

BI-RADS CATEGORY  1: Negative.

## 2018-05-22 ENCOUNTER — Telehealth: Payer: Self-pay | Admitting: Nurse Practitioner

## 2018-05-22 DIAGNOSIS — J00 Acute nasopharyngitis [common cold]: Secondary | ICD-10-CM

## 2018-05-22 MED ORDER — FLUTICASONE PROPIONATE 50 MCG/ACT NA SUSP: 2.0000 | Freq: Every day | NASAL | 6 refills | Status: AC

## 2018-05-22 NOTE — Progress Notes (Signed)
We are sorry you are not feeling well.  Here is how we plan to help!  Based on what you have shared with me, it looks like you may have a viral upper respiratory infection.  Upper respiratory infections are caused by a large number of viruses; however, rhinovirus is the most common cause.   Symptoms vary from person to person, with common symptoms including sore throat, cough, and fatigue or lack of energy.  A low-grade fever of up to 100.4 may present, but is often uncommon.  Symptoms vary however, and are closely related to a person's age or underlying illnesses.  The most common symptoms associated with an upper respiratory infection are nasal discharge or congestion, cough, sneezing, headache and pressure in the ears and face.  These symptoms usually persist for about 3 to 10 days, but can last up to 2 weeks.  It is important to know that upper respiratory infections do not cause serious illness or complications in most cases.    Upper respiratory infections can be transmitted from person to person, with the most common method of transmission being a person's hands.  The virus is able to live on the skin and can infect other persons for up to 2 hours after direct contact.  Also, these can be transmitted when someone coughs or sneezes; thus, it is important to cover the mouth to reduce this risk.  To keep the spread of the illness at [place], good hand hygiene is very important.  This is an infection that is most likely caused by a virus. There are no specific treatments other than to help you with the symptoms until the infection runs its course.  We are sorry you are not feeling well.  Here is how we plan to help!   For nasal congestion, you may use an oral decongestants such as Mucinex D or if you have glaucoma or high blood pressure use plain Mucinex.  Saline nasal spray or nasal drops can help and can safely be used as often as needed for congestion.  For your congestion, I have prescribed Fluticasone  nasal spray one spray in each nostril twice a day  If you do not have a history of heart disease, hypertension, diabetes or thyroid disease, prostate/bladder issues or glaucoma, you may also use Sudafed to treat nasal congestion.  It is highly recommended that you consult with a pharmacist or your primary care physician to ensure this medication is safe for you to take.     If you have a cough, you may use cough suppressants such as Delsym and Robitussin.  If you have glaucoma or high blood pressure, you can also use Coricidin HBP.     If you have a sore or scratchy throat, use a saltwater gargle-  to  teaspoon of salt dissolved in a 4-ounce to 8-ounce glass of warm water.  Gargle the solution for approximately 15-30 seconds and then spit.  It is important not to swallow the solution.  You can also use throat lozenges/cough drops and Chloraseptic spray to help with throat pain or discomfort.  Warm or cold liquids can also be helpful in relieving throat pain.  For headache, pain or general discomfort, you can use Ibuprofen or Tylenol as directed.   Some authorities believe that zinc sprays or the use of Echinacea may shorten the course of your symptoms.   HOME CARE . Only take medications as instructed by your medical team. . Be sure to drink plenty of fluids. Water  is fine as well as fruit juices, sodas and electrolyte beverages. You may want to stay away from caffeine or alcohol. If you are nauseated, try taking small sips of liquids. How do you know if you are getting enough fluid? Your urine should be a pale yellow or almost colorless. . Get rest. . Taking a steamy shower or using a humidifier may help nasal congestion and ease sore throat pain. You can place a towel over your head and breathe in the steam from hot water coming from a faucet. . Using a saline nasal spray works much the same way. . Cough drops, hard candies and sore throat lozenges may ease your cough. . Avoid close contacts  especially the very young and the elderly . Cover your mouth if you cough or sneeze . Always remember to wash your hands.   GET HELP RIGHT AWAY IF: . You develop worsening fever. . If your symptoms do not improve within 10 days . You develop yellow or green discharge from your nose over 3 days. . You have coughing fits . You develop a severe head ache or visual changes. . You develop shortness of breath, difficulty breathing or start having chest pain . Your symptoms persist after you have completed your treatment plan  MAKE SURE YOU   Understand these instructions.  Will watch your condition.  Will get help right away if you are not doing well or get worse.  Your e-visit answers were reviewed by a board certified advanced clinical practitioner to complete your personal care plan. Depending upon the condition, your plan could have included both over the counter or prescription medications. Please review your pharmacy choice. If there is a problem, you may call our nursing hot line at and have the prescription routed to another pharmacy. Your safety is important to [place]. If you have drug allergies check your prescription carefully.   You can use MyChart to ask questions about today's visit, request a non-urgent call back, or ask for a work or school excuse for 24 hours related to this e-Visit. If it has been greater than 24 hours you will need to follow up with your provider, or enter a new e-Visit to address those concerns. You will get an e-mail in the next two days asking about your experience.  I hope that your e-visit has been valuable and will speed your recovery. Thank you for using e-visits.   5 minutes spent reviewing and documenting in chart.

## 2019-11-14 ENCOUNTER — Other Ambulatory Visit: Payer: Self-pay

## 2019-11-14 ENCOUNTER — Ambulatory Visit
Admission: RE | Admit: 2019-11-14 | Discharge: 2019-11-14 | Disposition: A | Discharge status: Home / Self Care | Payer: Self-pay | Source: Ambulatory Visit | Attending: Emergency Medicine | Admitting: Emergency Medicine

## 2019-11-14 VITALS — BP 146/86 | HR 85 | Temp 98.3°F | Resp 19 | Ht 67.0 in | Wt 180.0 lb

## 2019-11-14 DIAGNOSIS — M5412 Radiculopathy, cervical region: Secondary | ICD-10-CM

## 2019-11-14 DIAGNOSIS — H1032 Unspecified acute conjunctivitis, left eye: Secondary | ICD-10-CM

## 2019-11-14 HISTORY — DX: Essential (primary) hypertension: I10

## 2019-11-14 MED ORDER — GABAPENTIN 100 MG PO CAPS: 100.0000 mg | ORAL_CAPSULE | Freq: Three times a day (TID) | ORAL | 0 refills | Status: AC

## 2019-11-14 MED ORDER — POLYMYXIN B-TRIMETHOPRIM 10000-0.1 UNIT/ML-% OP SOLN
1.0000 [drp] | OPHTHALMIC | 0 refills | Status: AC
Start: 2019-11-14 — End: 2019-11-24

## 2019-11-14 NOTE — ED Provider Notes (Signed)
[facility]   [id number] 11/14/19 Arrival Time: [place]  CC: Red eye  SUBJECTIVE:  Tiffany Avila is a 64 y.o. female who presented to the urgent care for complaint of left eye irritation and redness for the past 2 weeks.  Denies a precipitating event, trauma, or close contacts with similar symptoms.  Has tried OTC eye drops without relief.  Denies aggravating factors.  Denies similar symptoms in the past.  Denies fever, chills, nausea, vomiting, eye pain, painful eye movements, halos, discharge, itching, vision changes, double vision, FB sensation, periorbital erythema.   Report contact lens use.    She is also reporting pain, tingling to right arm for the past few days and believes she may have herniated disc.  Has not tried any  medication.  Denies similar symptoms in the past.  Denies fever, fever, nausea, vomiting.  ROS: As per HPI.  All other pertinent ROS negative.     Past Medical History:  Diagnosis Date  . Hypertension   . 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 BREAST ENHANCEMENT SURGERY  2000   No Known Allergies No current facility-administered medications on file prior to encounter.   Current Outpatient Medications on File Prior to Encounter  Medication Sig Dispense Refill  . buPROPion (WELLBUTRIN XL) 150 MG 24 hr tablet Take 1 tablet by mouth daily.    Marland Kitchen estradiol (ESTRACE) 0.5 MG tablet Take 1 tablet (0.5 mg total) by mouth daily. 90 tablet 4  . fluticasone (FLONASE) 50 MCG/ACT nasal spray Place 2 sprays into both nostrils daily. 16 g 6  . medroxyPROGESTERone (PROVERA) 2.5 MG tablet Take 1 tablet (2.5 mg total) by mouth daily. 90 tablet 4  . UNABLE TO FIND daily. Icool    . venlafaxine XR (EFFEXOR XR) 37.5 MG 24 hr capsule Take 1 capsule (37.5 mg total) by mouth daily. 90 capsule 3   Social History   Socioeconomic History  . Marital status: Divorced    Spouse name: Not on file   . Number of children: Not on file  . Years of education: Not on file  . Highest education level: Not on file  Occupational History  . Not on file  Tobacco Use  . Smoking status: Never Smoker  . Smokeless tobacco: Never Used  Substance and Sexual Activity  . Alcohol use: No  . Drug use: No  . Sexual activity: Yes    Partners: Male    Birth control/protection: None  Other Topics Concern  . Not on file  Social History Narrative  . Not on file   Social Determinants of Health   Financial Resource Strain:   . Difficulty of Paying Living Expenses: Not on file  Food Insecurity:   . Worried About Charity fundraiser in the Last Year: Not on file  . Ran Out of Food in the Last Year: Not on file  Transportation Needs:   . Lack of Transportation (Medical): Not on file  . Lack of Transportation (Non-Medical): Not on file  Physical Activity:   . Days of Exercise per Week: Not on file  . Minutes of Exercise per Session: Not on file  Stress:   . Feeling of Stress : Not on file  Social Connections:   . Frequency of Communication with Friends and Family: Not on file  . Frequency of Social Gatherings with Friends and Family: Not on file  . Attends Religious Services: Not  on file  . Active Member of Clubs or Organizations: Not on file  . Attends Archivist Meetings: Not on file  . Marital Status: Not on file  Intimate Partner Violence:   . Fear of Current or Ex-Partner: Not on file  . Emotionally Abused: Not on file  . Physically Abused: Not on file  . Sexually Abused: Not on file   Family History  Problem Relation Age of Onset  . Lupus Mother   . Heart disease Father   . Bladder Cancer Father 32  . Lung cancer Father   . Breast cancer Sister 20  . Cancer - Other Brother 2       rhabnosasrcoma of inner ear  . Hypertension Maternal Grandmother   . Hypertension Maternal Grandfather   . Hypertension Paternal Grandmother   . Hypertension Paternal Grandfather      OBJECTIVE:    Visual Acuity  Right Eye Distance:   Left Eye Distance:   Bilateral Distance:    Right Eye Near:   Left Eye Near:    Bilateral Near:      Vitals:   11/14/19 1852  BP: (!) 146/86  Pulse: 85  Resp: 19  Temp: 98.3 F (36.8 C)  TempSrc: Oral  SpO2: 96%  Weight: 180 lb (81.6 kg)  Height: 5\' 7"  (1.702 m)    Physical Exam Vitals and nursing note reviewed.  Constitutional:      General: She is not in acute distress.    Appearance: Normal appearance. She is normal weight. She is not ill-appearing, toxic-appearing or diaphoretic.  HENT:     Head: Normocephalic.  Eyes:     General: Lids are normal. Lids are everted, no foreign bodies appreciated. Vision grossly intact. Gaze aligned appropriately. No visual field deficit.       Left eye: Discharge present.    Comments: Left redness  Cardiovascular:     Rate and Rhythm: Normal rate and regular rhythm.     Pulses: Normal pulses.     Heart sounds: Normal heart sounds. No murmur heard.  No friction rub. No gallop.   Pulmonary:     Effort: Pulmonary effort is normal. No respiratory distress.     Breath sounds: Normal breath sounds. No stridor. No wheezing, rhonchi or rales.  Chest:     Chest wall: No tenderness.  Neurological:     General: No focal deficit present.     Mental Status: She is alert and oriented to person, place, and time.     GCS: GCS eye subscore is 4. GCS verbal subscore is 5. GCS motor subscore is 6.     Sensory: Sensation is intact.     Motor: Motor function is intact.     Coordination: Coordination is intact.     Gait: Gait is intact.      ASSESSMENT & PLAN:  1. Acute bacterial conjunctivitis of left eye   2. Cervical radiculopathy     Meds ordered this encounter  Medications  . trimethoprim-polymyxin b (POLYTRIM) ophthalmic solution    Sig: Place 1 drop into both eyes every 4 (four) hours for 10 days.    Dispense:  10 mL    Refill:  0  . gabapentin (NEURONTIN) 100 MG capsule     Sig: Take 1 capsule (100 mg total) by mouth 3 (three) times daily.    Dispense:  60 capsule    Refill:  0    Discharge instructions  Use eye drops as prescribed and to completion  Dispose of old contacts and wear glasses until you have finished course of antibiotic eye drops Wash pillow cases, wash hands regularly with soap and water, avoid touching your face and eyes, wash door handles, light switches, remotes and other objects you frequently touch Return or follow up with PCP if symptoms persists such as fever, chills, redness, swelling, eye pain, painful eye movements, vision changes, etc...  Gabapentin was prescribed for possible cervical radiculopathy  Reviewed expectations re: course of current medical issues. Questions answered. Outlined signs and symptoms indicating need for more acute intervention. Patient verbalized understanding. After Visit Summary given.   Emerson Monte, FNP 11/14/19 1954

## 2019-11-14 NOTE — ED Triage Notes (Signed)
LT eye irritated and red for x 2 weeks. Also reports she thinks she has a pinched nerve in her neck.  When she lays down her LT arm hurts x 1 month.  Also reports a rash to forehead that was painful to touch yesterday.

## 2019-11-14 NOTE — Discharge Instructions (Addendum)
Use eye drops as prescribed and to completion Dispose of old contacts and wear glasses until you have finished course of antibiotic eye drops Wash pillow cases, wash hands regularly with soap and water, avoid touching your face and eyes, wash door handles, light switches, remotes and other objects you frequently touch Return or follow up with PCP if symptoms persists such as fever, chills, redness, swelling, eye pain, painful eye movements, vision changes, etc...  Gabapentin was prescribed for possible cervical radiculopathy

## 2020-02-23 ENCOUNTER — Inpatient Hospital Stay (HOSPITAL_COMMUNITY)
Admission: EM | Disposition: A | Discharge status: Home / Self Care | Payer: 59 | Source: Home / Self Care | Attending: Emergency Medicine

## 2020-02-23 ENCOUNTER — Ambulatory Visit (HOSPITAL_COMMUNITY)
Admission: EM | Admit: 2020-02-23 | Discharge: 2020-02-23 | Disposition: A | Discharge status: Home / Self Care | Payer: 59 | Attending: General Surgery | Admitting: General Surgery

## 2020-02-23 ENCOUNTER — Emergency Department (HOSPITAL_COMMUNITY): Payer: 59 | Admitting: Anesthesiology

## 2020-02-23 ENCOUNTER — Other Ambulatory Visit: Payer: Self-pay

## 2020-02-23 ENCOUNTER — Emergency Department (HOSPITAL_COMMUNITY): Payer: 59

## 2020-02-23 ENCOUNTER — Encounter (HOSPITAL_COMMUNITY): Payer: Self-pay | Admitting: Emergency Medicine

## 2020-02-23 DIAGNOSIS — Z79899 Other long term (current) drug therapy: Secondary | ICD-10-CM | POA: Diagnosis not present

## 2020-02-23 DIAGNOSIS — K449 Diaphragmatic hernia without obstruction or gangrene: Secondary | ICD-10-CM | POA: Insufficient documentation

## 2020-02-23 DIAGNOSIS — Z801 Family history of malignant neoplasm of trachea, bronchus and lung: Secondary | ICD-10-CM | POA: Insufficient documentation

## 2020-02-23 DIAGNOSIS — Z803 Family history of malignant neoplasm of breast: Secondary | ICD-10-CM | POA: Diagnosis not present

## 2020-02-23 DIAGNOSIS — K436 Other and unspecified ventral hernia with obstruction, without gangrene: Secondary | ICD-10-CM | POA: Diagnosis not present

## 2020-02-23 DIAGNOSIS — Z8052 Family history of malignant neoplasm of bladder: Secondary | ICD-10-CM | POA: Insufficient documentation

## 2020-02-23 DIAGNOSIS — R1010 Upper abdominal pain, unspecified: Secondary | ICD-10-CM

## 2020-02-23 DIAGNOSIS — Z8489 Family history of other specified conditions: Secondary | ICD-10-CM | POA: Diagnosis not present

## 2020-02-23 DIAGNOSIS — K5730 Diverticulosis of large intestine without perforation or abscess without bleeding: Secondary | ICD-10-CM | POA: Diagnosis not present

## 2020-02-23 DIAGNOSIS — Z808 Family history of malignant neoplasm of other organs or systems: Secondary | ICD-10-CM | POA: Diagnosis not present

## 2020-02-23 DIAGNOSIS — Z20822 Contact with and (suspected) exposure to covid-19: Secondary | ICD-10-CM | POA: Insufficient documentation

## 2020-02-23 DIAGNOSIS — Z8249 Family history of ischemic heart disease and other diseases of the circulatory system: Secondary | ICD-10-CM | POA: Insufficient documentation

## 2020-02-23 HISTORY — PX: UMBILICAL HERNIA REPAIR: SHX196

## 2020-02-23 LAB — CBC WITH DIFFERENTIAL/PLATELET
Abs Immature Granulocytes: 0.03 10*3/uL (ref 0.00–0.07)
Basophils Absolute: 0 10*3/uL (ref 0.0–0.1)
Basophils Relative: 0 %
Eosinophils Absolute: 0 10*3/uL (ref 0.0–0.5)
Eosinophils Relative: 0 %
HCT: 36.3 % (ref 36.0–46.0)
Hemoglobin: 11.5 g/dL — ABNORMAL LOW (ref 12.0–15.0)
Immature Granulocytes: 0 %
Lymphocytes Relative: 8 %
Lymphs Abs: 0.6 10*3/uL — ABNORMAL LOW (ref 0.7–4.0)
MCH: 32.6 pg (ref 26.0–34.0)
MCHC: 31.7 g/dL (ref 30.0–36.0)
MCV: 102.8 fL — ABNORMAL HIGH (ref 80.0–100.0)
Monocytes Absolute: 0.5 10*3/uL (ref 0.1–1.0)
Monocytes Relative: 7 %
Neutro Abs: 6.7 10*3/uL (ref 1.7–7.7)
Neutrophils Relative %: 85 %
Platelets: 177 10*3/uL (ref 150–400)
RBC: 3.53 MIL/uL — ABNORMAL LOW (ref 3.87–5.11)
RDW: 13.7 % (ref 11.5–15.5)
WBC: 7.9 10*3/uL (ref 4.0–10.5)
nRBC: 0 % (ref 0.0–0.2)

## 2020-02-23 LAB — RESP PANEL BY RT-PCR (FLU A&B, COVID) ARPGX2
Influenza A by PCR: NEGATIVE
Influenza B by PCR: NEGATIVE
SARS Coronavirus 2 by RT PCR: NEGATIVE

## 2020-02-23 LAB — COMPREHENSIVE METABOLIC PANEL
ALT: 13 U/L (ref 0–44)
AST: 16 U/L (ref 15–41)
Albumin: 3.7 g/dL (ref 3.5–5.0)
Alkaline Phosphatase: 101 U/L (ref 38–126)
Anion gap: 7 (ref 5–15)
BUN: 13 mg/dL (ref 8–23)
CO2: 26 mmol/L (ref 22–32)
Calcium: 8.8 mg/dL — ABNORMAL LOW (ref 8.9–10.3)
Chloride: 106 mmol/L (ref 98–111)
Creatinine, Ser: 0.83 mg/dL (ref 0.44–1.00)
GFR, Estimated: >60 mL/min (ref >60)
Glucose, Bld: 109 mg/dL — ABNORMAL HIGH (ref 70–99)
Potassium: 4.3 mmol/L (ref 3.5–5.1)
Sodium: 139 mmol/L (ref 135–145)
Total Bilirubin: 0.8 mg/dL (ref 0.3–1.2)
Total Protein: 6.5 g/dL (ref 6.5–8.1)

## 2020-02-23 LAB — LIPASE, BLOOD: Lipase: 24 U/L (ref 11–51)

## 2020-02-23 LAB — TROPONIN I (HIGH SENSITIVITY): Troponin I (High Sensitivity): <2 ng/L (ref <18)

## 2020-02-23 SURGERY — REPAIR, HERNIA, UMBILICAL, ADULT
Anesthesia: General

## 2020-02-23 MED ORDER — BUPIVACAINE LIPOSOME 1.3 % IJ SUSP
INTRAMUSCULAR | Status: AC
  Filled 2020-02-23: qty 20

## 2020-02-23 MED ORDER — PANTOPRAZOLE SODIUM 40 MG IV SOLR
40.0000 mg | Freq: Once | INTRAVENOUS | Status: AC
  Administered 2020-02-23: 40 mg via INTRAVENOUS
  Filled 2020-02-23: qty 40

## 2020-02-23 MED ORDER — PROPOFOL 10 MG/ML IV BOLUS
INTRAVENOUS | Status: DC | PRN
  Administered 2020-02-23: 150 mg via INTRAVENOUS

## 2020-02-23 MED ORDER — SUCCINYLCHOLINE CHLORIDE 20 MG/ML IJ SOLN
INTRAMUSCULAR | Status: DC | PRN
  Administered 2020-02-23: 120 mg via INTRAVENOUS

## 2020-02-23 MED ORDER — FENTANYL CITRATE (PF) 100 MCG/2ML IJ SOLN
INTRAMUSCULAR | Status: AC
  Filled 2020-02-23: qty 2

## 2020-02-23 MED ORDER — HYDROMORPHONE HCL 1 MG/ML IJ SOLN
0.5000 mg | Freq: Once | INTRAMUSCULAR | Status: AC
  Administered 2020-02-23: 0.5 mg via INTRAVENOUS
  Filled 2020-02-23: qty 1

## 2020-02-23 MED ORDER — CHLORHEXIDINE GLUCONATE 0.12 % MT SOLN
OROMUCOSAL | Status: AC
  Filled 2020-02-23: qty 15

## 2020-02-23 MED ORDER — ROCURONIUM 10MG/ML (10ML) SYRINGE FOR MEDFUSION PUMP - OPTIME
INTRAVENOUS | Status: DC | PRN
  Administered 2020-02-23: 30 mg via INTRAVENOUS

## 2020-02-23 MED ORDER — ONDANSETRON HCL 4 MG/2ML IJ SOLN
INTRAMUSCULAR | Status: DC | PRN
  Administered 2020-02-23: 4 mg via INTRAVENOUS

## 2020-02-23 MED ORDER — LORAZEPAM 2 MG/ML IJ SOLN
0.5000 mg | Freq: Once | INTRAMUSCULAR | Status: AC
  Administered 2020-02-23: 0.5 mg via INTRAVENOUS
  Filled 2020-02-23: qty 1

## 2020-02-23 MED ORDER — ORAL CARE MOUTH RINSE: 15.0000 mL | Freq: Once | OROMUCOSAL | Status: AC

## 2020-02-23 MED ORDER — BUPIVACAINE LIPOSOME 1.3 % IJ SUSP
INTRAMUSCULAR | Status: DC | PRN
  Administered 2020-02-23: 20 mL

## 2020-02-23 MED ORDER — SUGAMMADEX SODIUM 200 MG/2ML IV SOLN
INTRAVENOUS | Status: DC | PRN
  Administered 2020-02-23: 200 mg via INTRAVENOUS

## 2020-02-23 MED ORDER — CEFAZOLIN SODIUM-DEXTROSE 2-4 GM/100ML-% IV SOLN
INTRAVENOUS | Status: AC
  Filled 2020-02-23: qty 100

## 2020-02-23 MED ORDER — FENTANYL CITRATE (PF) 100 MCG/2ML IJ SOLN
INTRAMUSCULAR | Status: DC | PRN
  Administered 2020-02-23 (×2): 50 ug via INTRAVENOUS

## 2020-02-23 MED ORDER — CEFAZOLIN SODIUM-DEXTROSE 2-4 GM/100ML-% IV SOLN
2.0000 g | INTRAVENOUS | Status: AC
  Administered 2020-02-23: 2 g via INTRAVENOUS

## 2020-02-23 MED ORDER — LACTATED RINGERS IV SOLN: INTRAVENOUS | Status: DC

## 2020-02-23 MED ORDER — SUCCINYLCHOLINE CHLORIDE 200 MG/10ML IV SOSY
PREFILLED_SYRINGE | INTRAVENOUS | Status: AC
  Filled 2020-02-23: qty 10

## 2020-02-23 MED ORDER — OXYCODONE HCL 5 MG PO TABS: 5.0000 mg | ORAL_TABLET | ORAL | 0 refills | Status: AC | PRN

## 2020-02-23 MED ORDER — PROPOFOL 10 MG/ML IV BOLUS
INTRAVENOUS | Status: AC
  Filled 2020-02-23: qty 20

## 2020-02-23 MED ORDER — ONDANSETRON HCL 4 MG/2ML IJ SOLN
INTRAMUSCULAR | Status: AC
  Filled 2020-02-23: qty 2

## 2020-02-23 MED ORDER — CHLORHEXIDINE GLUCONATE CLOTH 2 % EX PADS: 6.0000 | MEDICATED_PAD | Freq: Once | CUTANEOUS | Status: DC

## 2020-02-23 MED ORDER — FENTANYL CITRATE (PF) 100 MCG/2ML IJ SOLN: 25.0000 ug | INTRAMUSCULAR | Status: DC | PRN

## 2020-02-23 MED ORDER — FENTANYL CITRATE (PF) 100 MCG/2ML IJ SOLN
INTRAMUSCULAR | Status: AC
  Administered 2020-02-23: 50 ug
  Filled 2020-02-23: qty 2

## 2020-02-23 MED ORDER — SCOPOLAMINE 1 MG/3DAYS TD PT72
1.0000 | MEDICATED_PATCH | TRANSDERMAL | Status: DC
  Administered 2020-02-23: 1.5 mg via TRANSDERMAL

## 2020-02-23 MED ORDER — CHLORHEXIDINE GLUCONATE 0.12 % MT SOLN
15.0000 mL | Freq: Once | OROMUCOSAL | Status: AC
  Administered 2020-02-23: 15 mL via OROMUCOSAL
  Filled 2020-02-23: qty 15

## 2020-02-23 MED ORDER — LACTATED RINGERS IV SOLN: INTRAVENOUS | Status: DC | PRN

## 2020-02-23 MED ORDER — SODIUM CHLORIDE 0.9 % IR SOLN
Status: DC | PRN
  Administered 2020-02-23: 1000 mL

## 2020-02-23 MED ORDER — MIDAZOLAM HCL 5 MG/5ML IJ SOLN
INTRAMUSCULAR | Status: DC | PRN
  Administered 2020-02-23: 2 mg via INTRAVENOUS

## 2020-02-23 MED ORDER — ROCURONIUM BROMIDE 10 MG/ML (PF) SYRINGE
PREFILLED_SYRINGE | INTRAVENOUS | Status: AC
  Filled 2020-02-23: qty 10

## 2020-02-23 MED ORDER — SCOPOLAMINE 1 MG/3DAYS TD PT72
MEDICATED_PATCH | TRANSDERMAL | Status: AC
  Filled 2020-02-23: qty 1

## 2020-02-23 MED ORDER — IOHEXOL 300 MG/ML  SOLN
100.0000 mL | Freq: Once | INTRAMUSCULAR | Status: AC | PRN
  Administered 2020-02-23: 100 mL via INTRAVENOUS

## 2020-02-23 MED ORDER — KETOROLAC TROMETHAMINE 30 MG/ML IJ SOLN
30.0000 mg | Freq: Once | INTRAMUSCULAR | Status: AC
  Administered 2020-02-23: 30 mg via INTRAVENOUS
  Filled 2020-02-23: qty 1

## 2020-02-23 MED ORDER — MIDAZOLAM HCL 2 MG/2ML IJ SOLN
INTRAMUSCULAR | Status: AC
  Filled 2020-02-23: qty 2

## 2020-02-23 MED ORDER — ONDANSETRON HCL 4 MG/2ML IJ SOLN: 4.0000 mg | Freq: Once | INTRAMUSCULAR | Status: DC | PRN

## 2020-02-23 MED ORDER — FENTANYL CITRATE (PF) 100 MCG/2ML IJ SOLN: 50.0000 ug | Freq: Once | INTRAMUSCULAR | Status: AC

## 2020-02-23 MED ORDER — ONDANSETRON HCL 4 MG PO TABS: 4.0000 mg | ORAL_TABLET | Freq: Three times a day (TID) | ORAL | 1 refills | Status: AC | PRN

## 2020-02-23 SURGICAL SUPPLY — 35 items
ADH SKN CLS APL DERMABOND .7 (GAUZE/BANDAGES/DRESSINGS) ×1
APL PRP STRL LF DISP 70% ISPRP (MISCELLANEOUS) ×1
BLADE SURG 15 STRL LF DISP TIS (BLADE) ×1 IMPLANT
BLADE SURG 15 STRL SS (BLADE) ×2
CHLORAPREP W/TINT 26 (MISCELLANEOUS) ×2 IMPLANT
CLOTH BEACON ORANGE TIMEOUT ST (SAFETY) ×2 IMPLANT
COVER LIGHT HANDLE STERIS (MISCELLANEOUS) ×4 IMPLANT
COVER WAND RF STERILE (DRAPES) ×2 IMPLANT
DERMABOND ADVANCED (GAUZE/BANDAGES/DRESSINGS) ×1
DERMABOND ADVANCED .7 DNX12 (GAUZE/BANDAGES/DRESSINGS) ×1 IMPLANT
ELECT REM PT RETURN 9FT ADLT (ELECTROSURGICAL) ×2
ELECTRODE REM PT RTRN 9FT ADLT (ELECTROSURGICAL) ×1 IMPLANT
GAUZE 4X4 16PLY RFD (DISPOSABLE) ×1 IMPLANT
GLOVE BIO SURGEON STRL SZ 6.5 (GLOVE) ×2 IMPLANT
GLOVE BIOGEL PI IND STRL 6.5 (GLOVE) ×1 IMPLANT
GLOVE BIOGEL PI IND STRL 7.0 (GLOVE) ×1 IMPLANT
GLOVE BIOGEL PI INDICATOR 6.5 (GLOVE) ×1
GLOVE BIOGEL PI INDICATOR 7.0 (GLOVE) ×2
GOWN STRL REUS W/TWL LRG LVL3 (GOWN DISPOSABLE) ×4 IMPLANT
INST SET MINOR GENERAL (KITS) ×2 IMPLANT
KIT TURNOVER KIT A (KITS) ×2 IMPLANT
MANIFOLD NEPTUNE II (INSTRUMENTS) ×2 IMPLANT
NDL HYPO 18GX1.5 BLUNT FILL (NEEDLE) ×1 IMPLANT
NDL HYPO 21X1.5 SAFETY (NEEDLE) ×1 IMPLANT
NEEDLE HYPO 18GX1.5 BLUNT FILL (NEEDLE) ×2 IMPLANT
NEEDLE HYPO 21X1.5 SAFETY (NEEDLE) ×2 IMPLANT
NS IRRIG 1000ML POUR BTL (IV SOLUTION) ×2 IMPLANT
PACK MINOR (CUSTOM PROCEDURE TRAY) ×2 IMPLANT
PAD ARMBOARD 7.5X6 YLW CONV (MISCELLANEOUS) ×2 IMPLANT
SET BASIN LINEN APH (SET/KITS/TRAYS/PACK) ×2 IMPLANT
SUT ETHIBOND NAB MO 7 #0 18IN (SUTURE) ×2 IMPLANT
SUT MNCRL AB 4-0 PS2 18 (SUTURE) ×2 IMPLANT
SUT VIC AB 3-0 SH 27 (SUTURE) ×2
SUT VIC AB 3-0 SH 27X BRD (SUTURE) ×1 IMPLANT
SYR 20ML LL LF (SYRINGE) ×4 IMPLANT

## 2020-02-23 NOTE — ED Triage Notes (Signed)
Sudden on-set of abdominal started about 0900.  Pain Is from bottom of breast to mid stomach with sharp pain going to back.  Rates pain 10/10, constant but pain intensity is in waves.  Given Zofran enroute.  Last BM was yesterday.  CBG 107.

## 2020-02-23 NOTE — Progress Notes (Signed)
[facility]  Patient bowel is viable, no need for resection. Fluid was a little white so elected to do culture and close primarily.   Updated boyfriend. Rx [facility]. Will call post op in 2 weeks.  If feeling ok can go home.  Algis Greenhouse, MD [facility] [address]ella R[address] [place], [place] [address] [phone] (office)

## 2020-02-23 NOTE — H&P (Signed)
[facility] History and Physical  Reason for Referral: Incarcerated hernia  Referring Physician:  Dr. Roderic Palau   Chief Complaint    Abdominal Pain      Breelan Curbow is a 65 y.o. female.  HPI: Ms. Mcmanigle is a 65 yo with minimal medical issues who comes in after bending over today to look at her gas logs and feeling pain and discomfort in her supraumbilcal area. She has known she had a hernia for some time but it caused no issues. She says that she has constant sharp pain and some nausea and vomiting.   She otherwise takes little medication and has had no abdominal surgery. She ate at 630AM.   Past Medical History:  Diagnosis Date  . Hypertension   . MVP (mitral valve prolapse) mid 80's   no dental prophylaxis    Past Surgical History:  Procedure Laterality Date  . BREAST ENHANCEMENT SURGERY  2000    Family History  Problem Relation Age of Onset  . Lupus Mother   . Heart disease Father   . Bladder Cancer Father 53  . Lung cancer Father   . Breast cancer Sister 61  . Cancer - Other Brother 2       rhabnosasrcoma of inner ear  . Hypertension Maternal Grandmother   . Hypertension Maternal Grandfather   . Hypertension Paternal Grandmother   . Hypertension Paternal Grandfather     Social History   Tobacco Use  . Smoking status: Never Smoker  . Smokeless tobacco: Never Used  Substance Use Topics  . Alcohol use: No  . Drug use: No    Medications: I have reviewed the patient's current medications. Current Facility-Administered Medications  Medication Dose Route Frequency Provider Last Rate Last Admin  . [START ON 02/24/2020] ceFAZolin (ANCEF) IVPB 2g/100 mL premix  2 g Intravenous On Call to OR Virl Cagey, MD      . Chlorhexidine Gluconate Cloth 2 % PADS 6 each  6 each Topical Once Virl Cagey, MD      . lactated ringers infusion   Intravenous Continuous Virl Cagey, MD 75 mL/hr at 02/23/20 1302 New Bag at 02/23/20 1302   Current  Outpatient Medications  Medication Sig Dispense Refill Last Dose  . buPROPion (WELLBUTRIN XL) 150 MG 24 hr tablet Take 1 tablet by mouth daily.     Marland Kitchen estradiol (ESTRACE) 0.5 MG tablet Take 1 tablet (0.5 mg total) by mouth daily. 90 tablet 4   . fluticasone (FLONASE) 50 MCG/ACT nasal spray Place 2 sprays into both nostrils daily. 16 g 6   . gabapentin (NEURONTIN) 100 MG capsule Take 1 capsule (100 mg total) by mouth 3 (three) times daily. 60 capsule 0   . medroxyPROGESTERone (PROVERA) 2.5 MG tablet Take 1 tablet (2.5 mg total) by mouth daily. 90 tablet 4   . UNABLE TO FIND daily. Icool     . venlafaxine XR (EFFEXOR XR) 37.5 MG 24 hr capsule Take 1 capsule (37.5 mg total) by mouth daily. 90 capsule 3    No Known Allergies  ROS:  A comprehensive review of systems was negative except for: Gastrointestinal: positive for abdominal pain, nausea and vomiting  Blood pressure 111/70, pulse 74, temperature 98 F (36.7 C), temperature source Oral, resp. rate 13, height 5\' 7"  (1.702 m), weight 84.2 kg, last menstrual period 02/20/2010, SpO2 100 %. Physical Exam Vitals reviewed.  Constitutional:      Appearance: She is well-developed.  HENT:     Head:  Normocephalic.  Eyes:     Extraocular Movements: Extraocular movements intact.  Cardiovascular:     Rate and Rhythm: Normal rate and regular rhythm.  Pulmonary:     Effort: Pulmonary effort is normal.  Abdominal:     General: There is no distension.     Tenderness: There is abdominal tenderness. There is no guarding or rebound.     Hernia: A hernia is present. Hernia is present in the ventral area.  Musculoskeletal:     Comments: No swelling and move all extremities   Skin:    General: Skin is warm and dry.  Neurological:     General: No focal deficit present.     Mental Status: She is alert and oriented to person, place, and time.  Psychiatric:        Mood and Affect: Mood normal.        Behavior: Behavior normal.     Results: Results  for orders placed or performed during the hospital encounter of 02/23/20 (from the past 48 hour(s))  CBC with Differential/Platelet     Status: Abnormal   Collection Time: 02/23/20 11:07 AM  Result Value Ref Range   WBC 7.9 4.0 - 10.5 K/uL   RBC 3.53 (L) 3.87 - 5.11 MIL/uL   Hemoglobin 11.5 (L) 12.0 - 15.0 g/dL   HCT 36.3 36.0 - 46.0 %   MCV 102.8 (H) 80.0 - 100.0 fL   MCH 32.6 26.0 - 34.0 pg   MCHC 31.7 30.0 - 36.0 g/dL   RDW 13.7 11.5 - 15.5 %   Platelets 177 150 - 400 K/uL   nRBC 0.0 0.0 - 0.2 %   Neutrophils Relative % 85 %   Neutro Abs 6.7 1.7 - 7.7 K/uL   Lymphocytes Relative 8 %   Lymphs Abs 0.6 (L) 0.7 - 4.0 K/uL   Monocytes Relative 7 %   Monocytes Absolute 0.5 0.1 - 1.0 K/uL   Eosinophils Relative 0 %   Eosinophils Absolute 0.0 0.0 - 0.5 K/uL   Basophils Relative 0 %   Basophils Absolute 0.0 0.0 - 0.1 K/uL   Immature Granulocytes 0 %   Abs Immature Granulocytes 0.03 0.00 - 0.07 K/uL    Comment: Performed at [facility], [address], [place], [place] [address]  Comprehensive metabolic panel     Status: Abnormal   Collection Time: 02/23/20 11:07 AM  Result Value Ref Range   Sodium 139 135 - 145 mmol/L   Potassium 4.3 3.5 - 5.1 mmol/L   Chloride 106 98 - 111 mmol/L   CO2 26 22 - 32 mmol/L   Glucose, Bld 109 (H) 70 - 99 mg/dL    Comment: Glucose reference range applies only to samples taken after fasting for at least 8 hours.   BUN 13 8 - 23 mg/dL   Creatinine, Ser 0.83 0.44 - 1.00 mg/dL   Calcium 8.8 (L) 8.9 - 10.3 mg/dL   Total Protein 6.5 6.5 - 8.1 g/dL   Albumin 3.7 3.5 - 5.0 g/dL   AST 16 15 - 41 U/L   ALT 13 0 - 44 U/L   Alkaline Phosphatase 101 38 - 126 U/L   Total Bilirubin 0.8 0.3 - 1.2 mg/dL   GFR, Estimated >60 >60 mL/min    Comment: (NOTE) Calculated using the CKD-EPI Creatinine Equation (2021)    Anion gap 7 5 - 15    Comment: Performed at [facility], [address] [place],  [address]  Lipase, blood  Status: None   Collection  Time: 02/23/20 11:07 AM  Result Value Ref Range   Lipase 24 11 - 51 U/L    Comment: Performed at [facility], [address] [place], [place] [address]  Troponin I (High Sensitivity)     Status: None   Collection Time: 02/23/20 11:07 AM  Result Value Ref Range   Troponin I (High Sensitivity) <2 <18 ng/L    Comment: (NOTE) Elevated high sensitivity troponin I (hsTnI) values and significant  changes across serial measurements may suggest ACS but many other  chronic and acute conditions are known to elevate hsTnI results.  Refer to the "Links" section for chest pain algorithms and additional  guidance. Performed at [facility], [address] [place], [place] [address]   Resp Panel by RT-PCR (Flu A&B, Covid) Nasopharyngeal Swab     Status: None   Collection Time: 02/23/20 11:47 AM   Specimen: Nasopharyngeal Swab; Nasopharyngeal(NP) swabs in vial transport medium  Result Value Ref Range   SARS Coronavirus 2 by RT PCR NEGATIVE NEGATIVE    Comment: (NOTE) SARS-CoV-2 target nucleic acids are NOT DETECTED.  The SARS-CoV-2 RNA is generally detectable in upper respiratory specimens during the acute phase of infection. The lowest concentration of SARS-CoV-2 viral copies this assay can detect is 138 copies/mL. A negative result does not preclude SARS-Cov-2 infection and should not be used as the sole basis for treatment or other patient management decisions. A negative result may occur with  improper specimen collection/handling, submission of specimen other than nasopharyngeal swab, presence of viral mutation(s) within the areas targeted by this assay, and inadequate number of viral copies(<138 copies/mL). A negative result must be combined with clinical observations, patient history, and epidemiological information. The expected result is Negative.  Fact Sheet for Patients:  BloggerCourse.com  Fact Sheet for Healthcare Providers:   SeriousBroker.it  This test is no t yet approved or cleared by the [place] FDA and  has been authorized for detection and/or diagnosis of SARS-CoV-2 by FDA under an Emergency Use Authorization (EUA). This EUA will remain  in effect (meaning this test can be used) for the duration of the COVID-19 declaration under Section 564(b)(1) of the Act, 21 U.S.C.section 360bbb-3(b)(1), unless the authorization is terminated  or revoked sooner.       Influenza A by PCR NEGATIVE NEGATIVE   Influenza B by PCR NEGATIVE NEGATIVE    Comment: (NOTE) The Xpert Xpress SARS-CoV-2/FLU/RSV plus assay is intended as an aid in the diagnosis of influenza from Nasopharyngeal swab specimens and should not be used as a sole basis for treatment. Nasal washings and aspirates are unacceptable for Xpert Xpress SARS-CoV-2/FLU/RSV testing.  Fact Sheet for Patients: BloggerCourse.com  Fact Sheet for Healthcare Providers: SeriousBroker.it  This test is not yet approved or cleared by the [place] FDA and has been authorized for detection and/or diagnosis of SARS-CoV-2 by FDA under an Emergency Use Authorization (EUA). This EUA will remain in effect (meaning this test can be used) for the duration of the COVID-19 declaration under Section 564(b)(1) of the Act, 21 U.S.C. section 360bbb-3(b)(1), unless the authorization is terminated or revoked.  Performed at [facility], [address] [place], [place] [address]    Personally reviewed- epigastric /supraumbilcal hernia with loop of small bowel, no overt obstruction but is incarcerated on exam, neck small < 1.5-2cm depending on plane  CT ABDOMEN PELVIS W CONTRAST  Result Date: 02/23/2020 CLINICAL DATA:  Abdominal pain and nausea EXAM: CT ABDOMEN AND PELVIS  WITH CONTRAST TECHNIQUE: Multidetector CT imaging of the abdomen and pelvis was performed using the standard protocol following  bolus administration of intravenous contrast. CONTRAST:  165mL OMNIPAQUE IOHEXOL 300 MG/ML  SOLN COMPARISON:  08/21/2014 FINDINGS: Lower chest: Bibasilar atelectasis.  Bilateral breast prostheses. Hepatobiliary: No focal liver abnormality is seen. No gallstones, gallbladder wall thickening, or biliary dilatation. Pancreas: Unremarkable. No pancreatic ductal dilatation or surrounding inflammatory changes. Spleen: Normal in size without focal abnormality. Adrenals/Urinary Tract: Unremarkable adrenal glands. Kidneys enhance symmetrically without focal lesion, stone, or hydronephrosis. Ureters are nondilated. Urinary bladder appears unremarkable. Stomach/Bowel: Moderate-sized hiatal hernia is new from prior. Small duodenal diverticulum. There is a loop of small bowel extending into a midline supraumbilical hernia (series 2, image 40). The bowel loops within the hernia sac is thickened and fluid-filled. There is mild induration and adjacent fluid within the hernia sac. Hernia neck measures 12 mm in diameter (series 6, image 66). The bowel upstream and downstream from the hernia are within normal limits without dilatation. Mild scattered colonic diverticulosis. No focal colonic wall thickening or inflammatory changes. Vascular/Lymphatic: No significant vascular findings are present. No enlarged abdominal or pelvic lymph nodes. Reproductive: Uterus and bilateral adnexa are unremarkable. Other: No free fluid within the abdomen or pelvis. No pneumoperitoneum. Musculoskeletal: No acute or significant osseous findings. IMPRESSION: 1. There is a short segment loop of small bowel extending into a midline supraumbilical hernia. The bowel loop within the hernia sac is thickened and fluid-filled. There is mild induration and adjacent fluid within the hernia sac. The bowel upstream and downstream from the hernia are within normal limits without dilatation to suggest obstruction at this time. Findings are concerning for incarcerated  hernia. 2. Moderate-sized hiatal hernia is new from prior. 3. Mild colonic diverticulosis without evidence of acute diverticulitis. Electronically Signed   By: Davina Poke D.O.   On: 02/23/2020 12:43     Assessment & Plan:  Charita Vongphachanh is a 65 y.o. female with incarcerated supraumbilical/epigastric hernia. She has a loop of bowel in the hernia. Discussed need for surgery after attempt at reduction with medication. Discussed risk of bleeding, infection, needing small bowel resection, use of mesh if possible, risk of recurrence. Discussed post op limitations.   -COVID negative  -OR for open hernia repair with possible mesh for incarcerated hernia  All questions were answered to the satisfaction of the patient and family, Boyfriend at bedside.   Virl Cagey 02/23/2020, 1:08 PM

## 2020-02-23 NOTE — ED Notes (Signed)
Patient transported to CT 

## 2020-02-23 NOTE — Transfer of Care (Signed)
Immediate Anesthesia Transfer of Care Note  Patient: Tiffany Avila  Procedure(s) Performed: PRIMARY REPAIR INCARCERATED EPIGASTRIC HERNIA (N/A )  Patient Location: PACU  Anesthesia Type:General  Level of Consciousness: unresponsive  Airway & Oxygen Therapy: Patient Spontanous Breathing and Patient connected to face mask oxygen  Post-op Assessment: Report given to RN and Post -op Vital signs reviewed and stable  Post vital signs: Reviewed and stable  Last Vitals:  Vitals Value Taken Time  BP 108/68 02/23/20 1500  Temp    Pulse 83 02/23/20 1505  Resp 20 02/23/20 1505  SpO2 90 % 02/23/20 1505  Vitals shown include unvalidated device data.  Last Pain:  Vitals:   02/23/20 1337  TempSrc: Oral  PainSc:       Patients Stated Pain Goal: 5 (02/23/20 1337)  Complications: No complications documented.

## 2020-02-23 NOTE — ED Provider Notes (Signed)
[facility] EMERGENCY DEPARTMENT Provider Note   CSN: [id number] Arrival date & time: 02/23/20  1030     History Chief Complaint  Patient presents with  . Abdominal Pain    Tiffany Avila is a 65 y.o. female.  Patient complains of abdominal pain.  It started around 9 AM and is severe.    No vomiting no fevers no chills  The history is provided by the patient and medical records.  Abdominal Pain Pain location:  Epigastric Pain quality: sharp   Pain radiates to:  Does not radiate Pain severity:  Severe Onset quality:  Sudden Timing:  Constant Progression:  Worsening Chronicity:  New Context: not alcohol use   Relieved by:  Nothing Worsened by:  Nothing Associated symptoms: no chest pain, no cough, no diarrhea, no fatigue and no hematuria        Past Medical History:  Diagnosis Date  . Hypertension   . MVP (mitral valve prolapse) mid 80's   no dental prophylaxis    Patient Active Problem List   Diagnosis Date Noted  . Incarcerated epigastric hernia 02/23/2020  . Postmenopausal hormone replacement therapy 08/18/2014  . 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 BREAST ENHANCEMENT SURGERY  2000     OB History    Gravida  0   Para  0   Term      Preterm      AB      Living        SAB      IAB      Ectopic      Multiple      Live Births              Family History  Problem Relation Age of Onset  . Lupus Mother   . Heart disease Father   . Bladder Cancer Father 22  . Lung cancer Father   . Breast cancer Sister 46  . Cancer - Other Brother 2       rhabnosasrcoma of inner ear  . Hypertension Maternal Grandmother   . Hypertension Maternal Grandfather   . Hypertension Paternal Grandmother   . Hypertension Paternal Grandfather     Social History   Tobacco Use  . Smoking status: Never Smoker  . Smokeless tobacco: Never Used  Substance Use Topics  .  Alcohol use: No  . Drug use: No    Home Medications Prior to Admission medications   Medication Sig Start Date End Date Taking? Authorizing Provider  buPROPion (WELLBUTRIN XL) 150 MG 24 hr tablet Take 1 tablet by mouth daily. 08/12/14  Yes [provider]  estradiol (ESTRACE) 0.5 MG tablet Take 1 tablet (0.5 mg total) by mouth daily. 08/18/14  Yes Ria Comment, FNP  fluticasone (FLONASE) 50 MCG/ACT nasal spray Place 2 sprays into both nostrils daily. 05/22/18  Yes Martin, Mary-Margaret, FNP  gabapentin (NEURONTIN) 100 MG capsule Take 1 capsule (100 mg total) by mouth 3 (three) times daily. 11/14/19  Yes Avegno, Zachery Dakins, FNP  medroxyPROGESTERone (PROVERA) 2.5 MG tablet Take 1 tablet (2.5 mg total) by mouth daily. 08/18/14  Yes Ria Comment, FNP  venlafaxine XR (EFFEXOR XR) 37.5 MG 24 hr capsule Take 1 capsule (37.5 mg total) by mouth daily. 01/11/15  Yes Ria Comment, FNP  UNABLE TO FIND daily. Icool    [provider]    Allergies    Patient has no known allergies.  Review of  Systems   Review of Systems  Constitutional: Negative for appetite change and fatigue.  HENT: Negative for congestion, ear discharge and sinus pressure.   Eyes: Negative for discharge.  Respiratory: Negative for cough.   Cardiovascular: Negative for chest pain.  Gastrointestinal: Positive for abdominal pain. Negative for diarrhea.  Genitourinary: Negative for frequency and hematuria.  Musculoskeletal: Negative for back pain.  Skin: Negative for rash.  Neurological: Negative for seizures and headaches.  Psychiatric/Behavioral: Negative for hallucinations.    Physical Exam Updated Vital Signs BP 111/70   Pulse 74   Temp 98 F (36.7 C) (Oral)   Resp 13   Ht 5\' 7"  (1.702 m)   Wt 84.2 kg   LMP 02/20/2010 (Approximate)   SpO2 100%   BMI 29.08 kg/m   Physical Exam Vitals and nursing note reviewed.  Constitutional:      Appearance: She is well-developed.  HENT:     Head:  Normocephalic.     Nose: Nose normal.  Eyes:     General: No scleral icterus.    Extraocular Movements: EOM normal.     Conjunctiva/sclera: Conjunctivae normal.  Neck:     Thyroid: No thyromegaly.  Cardiovascular:     Rate and Rhythm: Normal rate and regular rhythm.     Heart sounds: No murmur heard. No friction rub. No gallop.   Pulmonary:     Breath sounds: No stridor. No wheezing or rales.  Chest:     Chest wall: No tenderness.  Abdominal:     General: There is no distension.     Tenderness: There is abdominal tenderness. There is no rebound.     Comments: Patient with an abdominal hernia superior to the umbilicus that is not reducible it is about 3 inches in diameter  Musculoskeletal:        General: No edema. Normal range of motion.     Cervical back: Neck supple.  Lymphadenopathy:     Cervical: No cervical adenopathy.  Skin:    Findings: No erythema or rash.  Neurological:     Mental Status: She is alert and oriented to person, place, and time.     Motor: No abnormal muscle tone.     Coordination: Coordination normal.  Psychiatric:        Mood and Affect: Mood and affect normal.        Behavior: Behavior normal.     ED Results / Procedures / Treatments   Labs (all labs ordered are listed, but only abnormal results are displayed) Labs Reviewed  CBC WITH DIFFERENTIAL/PLATELET - Abnormal; Notable for the following components:      Result Value   RBC 3.53 (*)    Hemoglobin 11.5 (*)    MCV 102.8 (*)    Lymphs Abs 0.6 (*)    All other components within normal limits  COMPREHENSIVE METABOLIC PANEL - Abnormal; Notable for the following components:   Glucose, Bld 109 (*)    Calcium 8.8 (*)    All other components within normal limits  RESP PANEL BY RT-PCR (FLU A&B, COVID) ARPGX2  LIPASE, BLOOD  TROPONIN I (HIGH SENSITIVITY)  TROPONIN I (HIGH SENSITIVITY)    EKG None  Radiology CT ABDOMEN PELVIS W CONTRAST  Result Date: 02/23/2020 CLINICAL DATA:  Abdominal  pain and nausea EXAM: CT ABDOMEN AND PELVIS WITH CONTRAST TECHNIQUE: Multidetector CT imaging of the abdomen and pelvis was performed using the standard protocol following bolus administration of intravenous contrast. CONTRAST:  149mL OMNIPAQUE IOHEXOL 300 MG/ML  SOLN  COMPARISON:  08/21/2014 FINDINGS: Lower chest: Bibasilar atelectasis.  Bilateral breast prostheses. Hepatobiliary: No focal liver abnormality is seen. No gallstones, gallbladder wall thickening, or biliary dilatation. Pancreas: Unremarkable. No pancreatic ductal dilatation or surrounding inflammatory changes. Spleen: Normal in size without focal abnormality. Adrenals/Urinary Tract: Unremarkable adrenal glands. Kidneys enhance symmetrically without focal lesion, stone, or hydronephrosis. Ureters are nondilated. Urinary bladder appears unremarkable. Stomach/Bowel: Moderate-sized hiatal hernia is new from prior. Small duodenal diverticulum. There is a loop of small bowel extending into a midline supraumbilical hernia (series 2, image 40). The bowel loops within the hernia sac is thickened and fluid-filled. There is mild induration and adjacent fluid within the hernia sac. Hernia neck measures 12 mm in diameter (series 6, image 66). The bowel upstream and downstream from the hernia are within normal limits without dilatation. Mild scattered colonic diverticulosis. No focal colonic wall thickening or inflammatory changes. Vascular/Lymphatic: No significant vascular findings are present. No enlarged abdominal or pelvic lymph nodes. Reproductive: Uterus and bilateral adnexa are unremarkable. Other: No free fluid within the abdomen or pelvis. No pneumoperitoneum. Musculoskeletal: No acute or significant osseous findings. IMPRESSION: 1. There is a short segment loop of small bowel extending into a midline supraumbilical hernia. The bowel loop within the hernia sac is thickened and fluid-filled. There is mild induration and adjacent fluid within the hernia sac.  The bowel upstream and downstream from the hernia are within normal limits without dilatation to suggest obstruction at this time. Findings are concerning for incarcerated hernia. 2. Moderate-sized hiatal hernia is new from prior. 3. Mild colonic diverticulosis without evidence of acute diverticulitis. Electronically Signed   By: Davina Poke D.O.   On: 02/23/2020 12:43    Procedures Procedures (including critical care time)  Medications Ordered in ED Medications  ceFAZolin (ANCEF) IVPB 2g/100 mL premix (has no administration in time range)  Chlorhexidine Gluconate Cloth 2 % PADS 6 each (has no administration in time range)  lactated ringers infusion ( Intravenous New Bag/Given 02/23/20 1302)  lactated ringers infusion (has no administration in time range)  scopolamine (TRANSDERM-SCOP) 1 MG/3DAYS 1.5 mg (1.5 mg Transdermal Patch Applied 02/23/20 1334)  pantoprazole (PROTONIX) injection 40 mg (40 mg Intravenous Given 02/23/20 1051)  LORazepam (ATIVAN) injection 0.5 mg (0.5 mg Intravenous Given 02/23/20 1056)  fentaNYL (SUBLIMAZE) injection 50 mcg (50 mcg Intravenous Given 02/23/20 1044)  HYDROmorphone (DILAUDID) injection 0.5 mg (0.5 mg Intravenous Given 02/23/20 1106)  HYDROmorphone (DILAUDID) injection 0.5 mg (0.5 mg Intravenous Given 02/23/20 1149)  HYDROmorphone (DILAUDID) injection 0.5 mg (0.5 mg Intravenous Given 02/23/20 1246)  LORazepam (ATIVAN) injection 0.5 mg (0.5 mg Intravenous Given 02/23/20 1147)  iohexol (OMNIPAQUE) 300 MG/ML solution 100 mL (100 mLs Intravenous Contrast Given 02/23/20 1221)  chlorhexidine (PERIDEX) 0.12 % solution 15 mL (15 mLs Mouth/Throat Given 02/23/20 1334)    Or  MEDLINE mouth rinse ( Mouth Rinse See Alternative 02/23/20 1334)    ED Course  I have reviewed the triage vital signs and the nursing notes.  Pertinent labs & imaging results that were available during my care of the patient were reviewed by me and considered in my medical decision making (see chart for  details).    MDM Rules/Calculators/A&P                          Patient with incarcerated abdominal hernia.  General surgery will see the patient.  General surgery saw the patient and could not reduce the hernia so she will go to  surgery for reduction Final Clinical Impression(s) / ED Diagnoses Final diagnoses:  Pain of upper abdomen    Rx / DC Orders ED Discharge Orders    None       Milton Ferguson, MD 02/26/20 1131

## 2020-02-23 NOTE — Anesthesia Procedure Notes (Signed)
Procedure Name: Intubation Date/Time: 02/23/2020 1:47 PM Performed by: Louann Sjogren, MD Pre-anesthesia Checklist: Patient identified, Patient being monitored, Timeout performed, Emergency Drugs available and Suction available Patient Re-evaluated:Patient Re-evaluated prior to induction Oxygen Delivery Method: Circle System Utilized Preoxygenation: Pre-oxygenation with 100% oxygen Induction Type: IV induction Ventilation: Mask ventilation without difficulty Laryngoscope Size: Mac and 3 Grade View: Grade II Tube type: Oral Tube size: 7.0 mm Number of attempts: 1 Airway Equipment and Method: Stylet Placement Confirmation: ETT inserted through vocal cords under direct vision,  positive ETCO2 and breath sounds checked- equal and bilateral Secured at: 21 cm Tube secured with: Tape Dental Injury: Teeth and Oropharynx as per pre-operative assessment

## 2020-02-23 NOTE — ED Notes (Signed)
Dr Henreitta Leber at bedside.  Family at bedside.   Dr Henreitta Leber attempting reduction.

## 2020-02-23 NOTE — Anesthesia Postprocedure Evaluation (Signed)
Anesthesia Post Note  Patient: Tiffany Avila  Procedure(s) Performed: PRIMARY REPAIR INCARCERATED EPIGASTRIC HERNIA (N/A )  Patient location during evaluation: PACU Anesthesia Type: General Level of consciousness: sedated Pain management: pain level controlled Vital Signs Assessment: post-procedure vital signs reviewed and stable Respiratory status: spontaneous breathing Cardiovascular status: blood pressure returned to baseline Anesthetic complications: no   No complications documented.   Last Vitals:  Vitals:   02/23/20 1337 02/23/20 1500  BP: 115/67 108/68  Pulse:  81  Resp: 14 11  Temp: 36.7 C   SpO2:      Last Pain:  Vitals:   02/23/20 1337  TempSrc: Oral  PainSc:                  Windell Norfolk

## 2020-02-23 NOTE — Anesthesia Preprocedure Evaluation (Signed)
Anesthesia Evaluation  Patient identified by MRN, date of birth, ID band Patient awake    Reviewed: Allergy & Precautions, H&P , NPO status , Patient's Chart, lab work & pertinent test results, reviewed documented beta blocker date and time   Airway Mallampati: II  TM Distance: >3 FB Neck ROM: full    Dental no notable dental hx. (+) Teeth Intact   Pulmonary neg pulmonary ROS,    Pulmonary exam normal breath sounds clear to auscultation       Cardiovascular Exercise Tolerance: Good hypertension, negative cardio ROS   Rhythm:regular Rate:Normal     Neuro/Psych PSYCHIATRIC DISORDERS Anxiety Depression negative neurological ROS     GI/Hepatic negative GI ROS, Neg liver ROS,   Endo/Other  negative endocrine ROS  Renal/GU negative Renal ROS  negative genitourinary   Musculoskeletal   Abdominal   Peds  Hematology negative hematology ROS (+)   Anesthesia Other Findings   Reproductive/Obstetrics negative OB ROS                             Anesthesia Physical Anesthesia Plan  ASA: II and emergent  Anesthesia Plan: General   Post-op Pain Management:    Induction:   PONV Risk Score and Plan: Ondansetron  Airway Management Planned:   Additional Equipment:   Intra-op Plan:   Post-operative Plan:   Informed Consent: I have reviewed the patients History and Physical, chart, labs and discussed the procedure including the risks, benefits and alternatives for the proposed anesthesia with the patient or authorized representative who has indicated his/her understanding and acceptance.     Dental Advisory Given  Plan Discussed with: CRNA  Anesthesia Plan Comments:         Anesthesia Quick Evaluation

## 2020-02-23 NOTE — ED Notes (Signed)
Sats dropped to low 80's, placed on O2@2l /m.

## 2020-02-23 NOTE — ED Notes (Signed)
Consent form done and chart made for ED.  Family at bedside and patients personal belongings given Fleet Contras.

## 2020-02-23 NOTE — Op Note (Signed)
[facility] Operative Note  02/23/20  Preoperative Diagnosis:  Incarcerated epigastric hernia with bowel causing obstruction    Postoperative Diagnosis: Same   Procedure(s) Performed:  Primary repair incarcerated epigastric hernia    Surgeon: Leatrice Jewels. Henreitta Leber, MD   Assistants: No qualified resident was available    Anesthesia: General endotracheal   Anesthesiologist: Windell Norfolk, MD    Specimens:  Culture of white tinge peritoneal fluid    Estimated Blood Loss: Minimal   Blood Replacement: None    Complications: None   Wound Class: Clean (culture sent)    Operative Indications: Ms. Fahey is a 65 yo with incarcerated epigastric hernia with small bowel causing obstruction. Reduction in the ED was not possible. We discussed the risk of repair including bleeding, infection, risk of bowel resection, possible use of mesh versus primary repair, risk of recurrence.   Findings: 1.5cm fascia defect, small bowel dusky but viable and pink after opening fascia    Procedure: The patient was taken to the operating room and placed supine.. General endotracheal  anesthesia was induced. Intravenous antibiotics were administered per protocol.  An orogastric tube positioned to decompress the stomach. The abdomen was prepared and draped in the usual sterile fashion.   An incision was made over the supraumbilical region where the hernia was located. This was carried down through the fatty tissue to the hernia sac. With care the hernia sac was opened with scissors. A white tinged fluid without odor was seen and cultures were taken. The fatty hernia sac was dissected off with cautery. The neck of the hernia was 1.5 cm of less and a knuckle of bowel was present with some duskiness. The fascia was extended slightly superior and the bowel was freed and dropped back into the abdomen. It was inspected and appeared healthy and viable without signs of compromise.   Given the white  tinge fluid I was worried some translocation of bacteria could have occurred. I opted to close the defect with interrupted 0 Ethibond primarily. The cavity was irrigated with saline. The space was closed with interrupted 3-0 Vicryl. The skin was closed with 4-0 running subcuticular and dermabond.   Final inspection revealed acceptable hemostasis. All counts were correct at the end of the case. The patient was awakened from anesthesia and extubated without complication.  The patient went to the PACU in stable condition.   Algis Greenhouse, MD [facility] [address] [place], [place] [address] [phone] (office)

## 2020-02-23 NOTE — Discharge Instructions (Signed)
PATIENT INSTRUCTIONS POST-ANESTHESIA  IMMEDIATELY FOLLOWING SURGERY:  Do not drive or operate machinery for the first twenty four hours after surgery.  Do not make any important decisions for twenty four hours after surgery or while taking narcotic pain medications or sedatives.  If you develop intractable nausea and vomiting or a severe headache please notify your doctor immediately.  FOLLOW-UP:  Please make an appointment with your surgeon as instructed. You do not need to follow up with anesthesia unless specifically instructed to do so.  WOUND CARE INSTRUCTIONS (if applicable):  Keep a dry clean dressing on the anesthesia/puncture wound site if there is drainage.  Once the wound has quit draining you may leave it open to air.  Generally you should leave the bandage intact for twenty four hours unless there is drainage.  If the epidural site drains for more than 36-48 hours please call the anesthesia department.  QUESTIONS?:  Please feel free to call your physician or the hospital operator if you have any questions, and they will be happy to assist you.      Discharge Instructions Hernia:  Common Complaints: Pain at the incision site is common. This will improve with time. Take your pain medications as described below. Some nausea is common and poor appetite. The main goal is to stay hydrated the first few days after surgery.   Diet/ Activity: Diet as tolerated. You may not have an appetite, but it is important to stay hydrated.  Drink 64 ounces of water a day. Your appetite will return with time.  Shower per your regular routine daily.  Do not take hot showers. Take warm showers that are less than 10 minutes. Rest and listen to your body, but do not remain in bed all day.Walk everyday for at least 15-20 minutes.  Deep cough and move around every 1-2 hours in the first few days after surgery. Do not pick at the dermabond glue on your incision sites.  This glue film will remain in place for  1-2 weeks and will start to peel off. Do not place lotions or balms on your incision unless instructed to specifically by Dr. Constance Haw. Do not lift > 10 lbs, perform excessive bending, pushing, pulling, squatting for 6-8 weeks after surgery. Where your abdominal binder with activity as much as possible. The activity restrictions and the abdominal binder are to prevent hernia formation at your incision while you are healing.  Do not drive until not taking narcotics and feel like you can stir and slam on the breaks without any issues.   Pain Expectations and Narcotics: -After surgery you will have pain associated with your incisions and this is normal. The pain is muscular and nerve pain, and will get better with time. -You are encouraged and expected to take non narcotic medications like tylenol and ibuprofen (when able) to treat pain as multiple modalities can aid with pain treatment. -Narcotics are only used when pain is severe or there is breakthrough pain. -You are not expected to have a pain score of 0 after surgery, as we cannot prevent pain. A pain score of 3-4 that allows you to be functional, move, walk, and tolerate some activity is the goal. The pain will continue to improve over the days after surgery and is dependent on your surgery. -Due to [place] law, we are only able to give a certain amount of pain medication to treat post operative pain, and we only give additional narcotics on a patient by patient basis.  -For most  laparoscopic surgery, studies have shown that the majority of patients only need 10-15 narcotic pills, and for open surgeries most patients only need 15-20.   -Having appropriate expectations of pain and knowledge of pain management with non narcotics is important as we do not want anyone to become addicted to narcotic pain medication.  -Using ice packs in the first 48 hours and heating pads after 48 hours, wearing an abdominal binder (when recommended), and using over the  counter medications are all ways to help with pain management.   -Simple acts like meditation and mindfulness practices after surgery can also help with pain control and research has proven the benefit of these practices.  Medication: Take tylenol and ibuprofen as needed for pain control, alternating every 4-6 hours.  Example:  Tylenol 1000mg  @ 6am, 12noon, 6pm, (Do not exceed 4000mg  of tylenol a day). Ibuprofen 800mg  @ 9am, 3pm, 9pm, 3am (Do not exceed 3600mg  of ibuprofen a day).  Take Roxicodone for breakthrough pain every 4 hours.  Take Colace for constipation related to narcotic pain medication. If you do not have a bowel movement in 2 days, take Miralax over the counter.  Drink plenty of water to also prevent constipation.   Contact Information: If you have questions or concerns, please call our office, [phone], Monday- Thursday 8AM-5PM and Friday 8AM-12Noon.  If it is after hours or on the weekend, please call [facility] Main Number, [phone], and ask to speak to the surgeon on call for Dr. Wednesday at [facility].    Open Hernia Repair, Adult, Care After This sheet gives you information about how to care for yourself after your procedure. Your health care provider may also give you more specific instructions. If you have problems or questions, contact your health care provider. What can I expect after the procedure? After the procedure, it is common to have:  Mild discomfort.  Slight bruising.  Minor swelling.  Pain in the abdomen. Follow these instructions at home: Incision care   Follow instructions from your health care provider about how to take care of your incision area. Make sure you: ? Wash your hands with soap and water before you change your bandage (dressing). If soap and water are not available, use hand sanitizer. ? Change your dressing as told by your health care provider. ? Leave stitches (sutures), skin glue, or adhesive strips in place. These  skin closures may need to stay in place for 2 weeks or longer. If adhesive strip edges start to loosen and curl up, you may trim the loose edges. Do not remove adhesive strips completely unless your health care provider tells you to do that.  Check your incision area every day for signs of infection. Check for: ? More redness, swelling, or pain. ? More fluid or blood. ? Warmth. ? Pus or a bad smell. Activity  Do not drive or use heavy machinery while taking prescription pain medicine. Do not drive until your health care provider approves.  Until your health care provider approves: ? Do not lift anything that is heavier than 10 lb (4.5 kg). ? Do not play contact sports.  Return to your normal activities as told by your health care provider. Ask your health care provider what activities are safe. General instructions  To prevent or treat constipation while you are taking prescription pain medicine, your health care provider may recommend that you: ? Drink enough fluid to keep your urine clear or pale yellow. ? Take over-the-counter or prescription medicines. ?  Eat foods that are high in fiber, such as fresh fruits and vegetables, whole grains, and beans. ? Limit foods that are high in fat and processed sugars, such as fried and sweet foods.  Take over-the-counter and prescription medicines only as told by your health care provider.  Do not take tub baths or go swimming until your health care provider approves.  Keep all follow-up visits as told by your health care provider. This is important. Contact a health care provider if:  You develop a rash.  You have more redness, swelling, or pain around your incision.  You have more fluid or blood coming from your incision.  Your incision feels warm to the touch.  You have pus or a bad smell coming from your incision.  You have a fever or chills.  You have blood in your stool (feces).  You have not had a bowel movement in 2-3  days.  Your pain is not controlled with medicine. Get help right away if:  You have chest pain or shortness of breath.  You feel light-headed or feel faint.  You have severe pain.  You vomit and your pain is worse. This information is not intended to replace advice given to you by your health care provider. Make sure you discuss any questions you have with your health care provider. Document Revised: 01/19/2017 Document Reviewed: 07/21/2015 Elsevier Patient Education  2020 [employer].

## 2020-02-23 NOTE — ED Notes (Signed)
Accompanied Britta Mccreedy, RN in transporting pt to Day Surgery.

## 2020-02-23 NOTE — ED Notes (Signed)
Sats back up to 98-100%, O2 turned off.

## 2020-02-24 ENCOUNTER — Encounter (HOSPITAL_COMMUNITY): Payer: Self-pay | Admitting: General Surgery

## 2020-02-28 LAB — AEROBIC/ANAEROBIC CULTURE W GRAM STAIN (SURGICAL/DEEP WOUND)
Culture: NO GROWTH
Gram Stain: NONE SEEN

## 2020-03-09 ENCOUNTER — Telehealth (INDEPENDENT_AMBULATORY_CARE_PROVIDER_SITE_OTHER): Payer: 59 | Admitting: General Surgery

## 2020-03-09 DIAGNOSIS — K436 Other and unspecified ventral hernia with obstruction, without gangrene: Secondary | ICD-10-CM

## 2020-03-09 NOTE — Progress Notes (Addendum)
[facility]  I am calling the patient for post operative evaluation. This is not a billable encounter as it is under the [place] charges for the surgery.  The patient had a primary umbilical hernia repair on 02/24/2020. Her culture of the whitish fluid in the hernia sac was negative for any growth. I opted to just do a primary repair pending any translocation or bacteria in that fluid.   I left her a message. Told her to call the office.  No heavy lifting > 10 lbs, excessive bending, pushing, pulling, or squatting for 6-8 weeks after surgery.   Will see the patient PRN.   Patient called back and we were able to connect. She reports that she is doing well and not having soreness or pain.  The incision is healing well. Told her she might want to wear a binder so she reminds herself to not lift or doing any major activity.   Curlene Labrum, MD [facility] [address] [place], [place] [address] [phone] (office)

## 2020-04-05 ENCOUNTER — Other Ambulatory Visit: Payer: Self-pay | Admitting: Family Medicine

## 2020-04-05 DIAGNOSIS — Z0000 Encounter for general adult medical examination without abnormal findings: Secondary | ICD-10-CM

## 2020-04-13 NOTE — Progress Notes (Shared)
[facility] Note  04/14/2020     CHIEF COMPLAINT Patient presents for Retina Follow Up   HISTORY OF PRESENT ILLNESS: Tiffany Avila is a 65 y.o. female who presents to the clinic today for:   HPI    Retina Follow Up    Patient presents with  Other.  In both eyes.  I, the attending physician,  performed the HPI with the patient and updated documentation appropriately.          Comments    Retina eval per Dr. Lucita Ferrara for cataract clearance.  Patient has been told she has a nevus OD in the past.  Hx shingles Right side of face and OD 10/2019.  Uses Ats.  Dr. Lucita Ferrara is starting her on Restasis.  Cataract sx 05/11/20 OD, 05/18/20 OS.        Last edited by Bernarda Caffey, MD on 04/14/2020  1:11 PM. (History)    pt is here on the referral of Dr. Lucita Ferrara for cataract clearance, pt states she had shingles in her right eye in September and since then she has not been able to wear her CL due to pain and cloudiness, she states it is hard for her to work bc she has to be on the computer and it hurts her eyes, during her shingles flare up, she saw Dr. Kathrin Penner once a week, she states she was not getting anywhere with her, so she went to see Dr[facility] Dellis An[facility] at [facility], she states she is still taking valacyclovir, pt wanted to have lasik since she can't wear CL anymore and that's how she met Dr. Lucita Ferrara, she is using Systane BID OU and will start Restasis per Dr. Lucita Ferrara, pt states she had shingles in the second grade as well and afterwards she had to wear glasses  Referring physician: Vevelyn Royals MD [address] [place] [address] [place], [place] [address]  HISTORICAL INFORMATION:   Selected notes from the [place] Referred by Dr. Lucita Ferrara for cataract clearance LEE: 04/12/2020 BCVA OD: 20/30 OS: 20/30 Ocular Hx- cataracts, dry eye PMH- HTN, shingles    CURRENT MEDICATIONS: No current outpatient medications on file. (Ophthalmic Drugs)    No current facility-administered medications for this visit. (Ophthalmic Drugs)   Current Outpatient Medications (Other)  Medication Sig  . gabapentin (NEURONTIN) 100 MG capsule Take 1 capsule (100 mg total) by mouth 3 (three) times daily.  Marland Kitchen buPROPion (WELLBUTRIN XL) 150 MG 24 hr tablet Take 1 tablet by mouth daily.  Marland Kitchen estradiol (ESTRACE) 0.5 MG tablet Take 1 tablet (0.5 mg total) by mouth daily.  . fluticasone (FLONASE) 50 MCG/ACT nasal spray Place 2 sprays into both nostrils daily.  Marland Kitchen lisinopril (ZESTRIL) 5 MG tablet Take 5 mg by mouth 2 (two) times daily.  . medroxyPROGESTERone (PROVERA) 2.5 MG tablet Take 1 tablet (2.5 mg total) by mouth daily.  . ondansetron (ZOFRAN) 4 MG tablet Take 1 tablet (4 mg total) by mouth every 8 (eight) hours as needed.  Marland Kitchen oxyCODONE (ROXICODONE) 5 MG immediate release tablet Take 1 tablet (5 mg total) by mouth every 4 (four) hours as needed for severe pain. (Patient not taking: Reported on 04/14/2020)  . UNABLE TO FIND daily. Icool  . VALTREX 500 MG tablet Take 500 mg by mouth 2 (two) times daily.  Marland Kitchen venlafaxine XR (EFFEXOR XR) 37.5 MG 24 hr capsule Take 1 capsule (37.5 mg total) by mouth daily.   No current facility-administered medications for this visit. (Other)  REVIEW OF SYSTEMS: ROS    Positive for: Eyes   Negative for: Constitutional, Gastrointestinal, Neurological, Skin, Genitourinary, Musculoskeletal, HENT, Endocrine, Cardiovascular, Respiratory, Psychiatric, Allergic/Imm, Heme/Lymph   Last edited by Leonie Douglas, COA on 04/14/2020  8:18 AM. (History)       ALLERGIES No Known Allergies  PAST MEDICAL HISTORY Past Medical History:  Diagnosis Date  . Hypertension   . 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 BREAST ENHANCEMENT SURGERY  2000  . UMBILICAL HERNIA REPAIR N/A 02/23/2020   Procedure: PRIMARY REPAIR INCARCERATED EPIGASTRIC HERNIA;  Surgeon:  Virl Cagey, MD;  Location: [facility];  Service: General;  Laterality: N/A;    FAMILY HISTORY Family History  Problem Relation Age of Onset  . Lupus Mother   . Heart disease Father   . Bladder Cancer Father 25  . Lung cancer Father   . Breast cancer Sister 89  . Cancer - Other Brother 2       rhabnosasrcoma of inner ear  . Hypertension Maternal Grandmother   . Hypertension Maternal Grandfather   . Hypertension Paternal Grandmother   . Hypertension Paternal Grandfather     SOCIAL HISTORY Social History   Tobacco Use  . Smoking status: Never Smoker  . Smokeless tobacco: Never Used  Substance Use Topics  . Alcohol use: No  . Drug use: No         OPHTHALMIC EXAM:  Base Eye Exam    Visual Acuity (Snellen - Linear)      Right Left   Dist cc 20/40 -2 20/50 +2   Dist ph cc NI 20/40       Tonometry (Tonopen, 8:32 AM)      Right Left   Pressure 7 9       Pupils      Dark Light Shape React APD   Right 3 2 Round Brisk None   Left 3 2 Round Brisk None       Visual Fields (Counting fingers)      Left Right    Full Full       Extraocular Movement      Right Left    Full Full       Neuro/Psych    Oriented x3: Yes   Mood/Affect: Normal       Dilation    Both eyes: 1.0% Mydriacyl, 2.5% Phenylephrine @ 8:32 AM        Slit Lamp and Fundus Exam    Slit Lamp Exam      Right Left   Lids/Lashes Dermatochalasis - upper lid, Meibomian gland dysfunction Dermatochalasis - upper lid, Meibomian gland dysfunction   Conjunctiva/Sclera White and quiet White and quiet   Cornea arcus, 2-3+ Punctate epithelial erosions, corneal haze / scars temporal midzone arcus, 1-2+ Punctate epithelial erosions   Anterior Chamber deep and clear, No cell or flare deep and clear, No cell or flare   Iris Round and reactive Round and reactive   Lens 2+ Nuclear sclerosis, 2+ Cortical cataract 2+ Nuclear sclerosis, 2+ Cortical cataract   Vitreous Vitreous syneresis Vitreous syneresis        Fundus Exam      Right Left   Disc Pink and Sharp, mild tilt, temporal PPA, peripapillary pigment, peripapillary nevus at 0100 -- flat, no SRF or org pigment Compact, Pink and Sharp, mild tilt, temporal PPA,   C/D Ratio 0.5 0.4   Macula Flat, Blunted  foveal reflex, mild RPE mottling, No heme or edema Flat, Blunted foveal reflex, RPE mottling, No heme or edema   Vessels mild attenuation, mild tortuousity mild attenuation, mild tortuousity   Periphery Attached, ?vortex varix at 0430    Attached           Refraction    Wearing Rx      Sphere Cylinder Axis   Right -7.50 +1.50 105   Left -7.50 +3.00 065       Manifest Refraction      Sphere Cylinder Axis Dist VA   Right -7.50 +1.25 105 NI   Left -7.00 +2.50 065 20/25          IMAGING AND PROCEDURES  Imaging and Procedures for 04/14/2020  OCT, Retina - OU - Both Eyes       Right Eye Quality was good. Central Foveal Thickness: 273. Progression has no prior data. Findings include normal foveal contour, no IRF, no SRF, myopic contour (Partial PVD).   Left Eye Quality was good. Central Foveal Thickness: 272. Progression has no prior data. Findings include normal foveal contour, no IRF, no SRF, myopic contour, vitreomacular adhesion .   Notes *Images captured and stored on drive  Diagnosis / Impression:  NFP, no IRF/SRF OU Myopic contour OU  Clinical management:  See below  Abbreviations: NFP - Normal foveal profile. CME - cystoid macular edema. PED - pigment epithelial detachment. IRF - intraretinal fluid. SRF - subretinal fluid. EZ - ellipsoid zone. ERM - epiretinal membrane. ORA - outer retinal atrophy. ORT - outer retinal tubulation. SRHM - subretinal hyper-reflective material. IRHM - intraretinal hyper-reflective material .                ASSESSMENT/PLAN:    ICD-10-CM   1. High myopia, both eyes  H52.13   2. Retinal edema  H35.81 OCT, Retina - OU - Both Eyes  3. Nevus of choroid of right eye  D31.31    4. Combined forms of age-related cataract of both eyes  H25.813   5. Ophthalmic herpes zoster  B02.30   6. Dry eyes  H04.123     1,2. High myopia w/ astigmatism OU  - no lattice or RT/RD on exam - discussed association of high myopia with lattice degeneration and increased risk of RT/RD  - clear from a retina standpoint to proceed with cataract surgery when pt and surgeon are ready  - f/u here prn  3. Choroidal nevus OD  - peripapillary nevus  - no suspicious features -- no SRF or orange pigment; small w/ no elevation  - has been tracked and imaged by [facility] Kathrin Penner)  - monitor  4. Mixed Cataract OU  - under the expert management of Dr. Lucita Ferrara - The symptoms of cataract, surgical options, and treatments and risks were discussed with patient. - discussed diagnosis and progression - surgery scheduled with Dr. Lucita Ferrara in March  5,6. History of HZO OD and Dry eyes OU  - OD with residual corneal scarring / haze  - DES OD > OS - using Systane BID OU -- recommend increasing to QID OU - starting Restasis per Dr. Lucita Ferrara  Ophthalmic Meds Ordered this visit:  No orders of the defined types were placed in this encounter.      Return if symptoms worsen or fail to improve.  There are no Patient Instructions on file for this visit.   Explained the diagnoses, plan, and follow up with the patient and they expressed understanding.  Patient expressed  understanding of the importance of proper follow up care.   This document serves as a record of services personally performed by Gardiner Sleeper, MD, PhD. It was created on their behalf by Roselee Nova, COMT. The creation of this record is the provider's dictation and/or activities during the visit.  Electronically signed by: Roselee Nova, COMT 04/14/20 1:29 PM   This document serves as a record of services personally performed by Gardiner Sleeper, MD, PhD. It was created on their behalf by San Jetty.  Owens Shark, OA an ophthalmic technician. The creation of this record is the provider's dictation and/or activities during the visit.    Electronically signed by: San Jetty. Owens Shark, [place] 02.23.2022 1:29 PM   Gardiner Sleeper, M.D., Ph.D. Diseases & Surgery of the Retina and Vitreous [facility]  I have reviewed the above documentation for accuracy and completeness, and I agree with the above. Gardiner Sleeper, M.D., Ph.D. 04/14/20 1:29 PM   Abbreviations: M myopia (nearsighted); A astigmatism; H hyperopia (farsighted); P presbyopia; Mrx spectacle prescription;  CTL contact lenses; OD right eye; OS left eye; OU both eyes  XT exotropia; ET esotropia; PEK punctate epithelial keratitis; PEE punctate epithelial erosions; DES dry eye syndrome; MGD meibomian gland dysfunction; ATs artificial tears; PFAT's preservative free artificial tears; [place] nuclear sclerotic cataract; PSC posterior subcapsular cataract; ERM epi-retinal membrane; PVD posterior vitreous detachment; RD retinal detachment; DM diabetes mellitus; DR diabetic retinopathy; NPDR non-proliferative diabetic retinopathy; PDR proliferative diabetic retinopathy; CSME clinically significant macular edema; DME diabetic macular edema; dbh dot blot hemorrhages; CWS cotton wool spot; POAG primary open angle glaucoma; C/D cup-to-disc ratio; HVF humphrey visual field; GVF goldmann visual field; OCT optical coherence tomography; IOP intraocular pressure; BRVO Branch retinal vein occlusion; CRVO central retinal vein occlusion; CRAO central retinal artery occlusion; BRAO branch retinal artery occlusion; RT retinal tear; SB scleral buckle; PPV pars plana vitrectomy; VH Vitreous hemorrhage; PRP panretinal laser photocoagulation; IVK intravitreal kenalog; VMT vitreomacular traction; MH Macular hole;  NVD neovascularization of the disc; NVE neovascularization elsewhere; AREDS age related eye disease study; ARMD age related macular degeneration; POAG  primary open angle glaucoma; EBMD epithelial/anterior basement membrane dystrophy; ACIOL anterior chamber intraocular lens; IOL intraocular lens; PCIOL posterior chamber intraocular lens; Phaco/IOL phacoemulsification with intraocular lens placement; [place] photorefractive keratectomy; LASIK laser assisted in situ keratomileusis; HTN hypertension; DM diabetes mellitus; COPD chronic obstructive pulmonary disease

## 2020-04-14 ENCOUNTER — Encounter (INDEPENDENT_AMBULATORY_CARE_PROVIDER_SITE_OTHER): Payer: Self-pay | Admitting: Ophthalmology

## 2020-04-14 ENCOUNTER — Other Ambulatory Visit: Payer: Self-pay

## 2020-04-14 ENCOUNTER — Ambulatory Visit (INDEPENDENT_AMBULATORY_CARE_PROVIDER_SITE_OTHER): Payer: 59 | Admitting: Ophthalmology

## 2020-04-14 DIAGNOSIS — H25813 Combined forms of age-related cataract, bilateral: Secondary | ICD-10-CM

## 2020-04-14 DIAGNOSIS — H3581 Retinal edema: Secondary | ICD-10-CM | POA: Diagnosis not present

## 2020-04-14 DIAGNOSIS — H5213 Myopia, bilateral: Secondary | ICD-10-CM | POA: Diagnosis not present

## 2020-04-14 DIAGNOSIS — D3131 Benign neoplasm of right choroid: Secondary | ICD-10-CM

## 2020-04-14 DIAGNOSIS — B0230 Zoster ocular disease, unspecified: Secondary | ICD-10-CM

## 2020-04-14 DIAGNOSIS — H04123 Dry eye syndrome of bilateral lacrimal glands: Secondary | ICD-10-CM

## 2020-05-25 ENCOUNTER — Ambulatory Visit: Payer: 59

## 2020-12-01 ENCOUNTER — Other Ambulatory Visit: Payer: Self-pay | Admitting: Family Medicine

## 2020-12-01 DIAGNOSIS — Z1231 Encounter for screening mammogram for malignant neoplasm of breast: Secondary | ICD-10-CM

## 2020-12-29 ENCOUNTER — Inpatient Hospital Stay: Admission: RE | Admit: 2020-12-29 | Payer: 59 | Source: Ambulatory Visit

## 2021-04-07 ENCOUNTER — Other Ambulatory Visit: Payer: Self-pay | Admitting: Internal Medicine

## 2021-04-12 ENCOUNTER — Ambulatory Visit
Admission: RE | Admit: 2021-04-12 | Discharge: 2021-04-12 | Disposition: A | Discharge status: Home / Self Care | Payer: 59 | Source: Ambulatory Visit

## 2021-04-12 DIAGNOSIS — Z1231 Encounter for screening mammogram for malignant neoplasm of breast: Secondary | ICD-10-CM

## 2021-05-09 ENCOUNTER — Other Ambulatory Visit: Payer: Self-pay | Admitting: Internal Medicine

## 2021-05-09 DIAGNOSIS — Z13820 Encounter for screening for osteoporosis: Secondary | ICD-10-CM

## 2021-08-04 ENCOUNTER — Ambulatory Visit
Admission: RE | Admit: 2021-08-04 | Discharge: 2021-08-04 | Disposition: A | Discharge status: Home / Self Care | Payer: 59 | Source: Ambulatory Visit | Attending: Internal Medicine | Admitting: Internal Medicine

## 2021-08-04 DIAGNOSIS — Z13820 Encounter for screening for osteoporosis: Secondary | ICD-10-CM

## 2023-05-24 DIAGNOSIS — F411 Generalized anxiety disorder: Secondary | ICD-10-CM | POA: Diagnosis not present

## 2023-05-24 DIAGNOSIS — F4321 Adjustment disorder with depressed mood: Secondary | ICD-10-CM | POA: Diagnosis not present

## 2023-08-09 DIAGNOSIS — L308 Other specified dermatitis: Secondary | ICD-10-CM | POA: Diagnosis not present

## 2023-08-09 DIAGNOSIS — F411 Generalized anxiety disorder: Secondary | ICD-10-CM | POA: Diagnosis not present

## 2023-08-09 DIAGNOSIS — F4321 Adjustment disorder with depressed mood: Secondary | ICD-10-CM | POA: Diagnosis not present

## 2023-09-07 ENCOUNTER — Encounter: Payer: Self-pay | Admitting: Advanced Practice Midwife

## 2023-10-09 ENCOUNTER — Other Ambulatory Visit: Payer: Self-pay | Admitting: Internal Medicine

## 2023-10-09 DIAGNOSIS — G8929 Other chronic pain: Secondary | ICD-10-CM | POA: Diagnosis not present

## 2023-10-09 DIAGNOSIS — Z0000 Encounter for general adult medical examination without abnormal findings: Secondary | ICD-10-CM | POA: Diagnosis not present

## 2023-10-09 DIAGNOSIS — L308 Other specified dermatitis: Secondary | ICD-10-CM | POA: Diagnosis not present

## 2023-10-09 DIAGNOSIS — I10 Essential (primary) hypertension: Secondary | ICD-10-CM | POA: Diagnosis not present

## 2023-10-09 DIAGNOSIS — I83892 Varicose veins of left lower extremities with other complications: Secondary | ICD-10-CM | POA: Diagnosis not present

## 2023-10-09 DIAGNOSIS — Z1159 Encounter for screening for other viral diseases: Secondary | ICD-10-CM | POA: Diagnosis not present

## 2023-10-09 DIAGNOSIS — E7800 Pure hypercholesterolemia, unspecified: Secondary | ICD-10-CM | POA: Diagnosis not present

## 2023-10-09 DIAGNOSIS — M545 Low back pain, unspecified: Secondary | ICD-10-CM | POA: Diagnosis not present

## 2023-10-09 DIAGNOSIS — F4321 Adjustment disorder with depressed mood: Secondary | ICD-10-CM | POA: Diagnosis not present

## 2023-10-09 DIAGNOSIS — N1831 Chronic kidney disease, stage 3a: Secondary | ICD-10-CM | POA: Diagnosis not present

## 2023-10-09 DIAGNOSIS — M810 Age-related osteoporosis without current pathological fracture: Secondary | ICD-10-CM | POA: Diagnosis not present

## 2023-10-09 DIAGNOSIS — F411 Generalized anxiety disorder: Secondary | ICD-10-CM | POA: Diagnosis not present

## 2023-10-09 DIAGNOSIS — Z1231 Encounter for screening mammogram for malignant neoplasm of breast: Secondary | ICD-10-CM

## 2023-11-14 ENCOUNTER — Ambulatory Visit

## 2023-11-20 DIAGNOSIS — I10 Essential (primary) hypertension: Secondary | ICD-10-CM | POA: Diagnosis not present

## 2023-11-20 DIAGNOSIS — F4321 Adjustment disorder with depressed mood: Secondary | ICD-10-CM | POA: Diagnosis not present

## 2023-11-20 DIAGNOSIS — M810 Age-related osteoporosis without current pathological fracture: Secondary | ICD-10-CM | POA: Diagnosis not present

## 2023-11-20 DIAGNOSIS — F411 Generalized anxiety disorder: Secondary | ICD-10-CM | POA: Diagnosis not present

## 2023-12-12 ENCOUNTER — Ambulatory Visit
Admission: RE | Admit: 2023-12-12 | Discharge: 2023-12-12 | Disposition: A | Discharge status: Home / Self Care | Source: Ambulatory Visit | Attending: Internal Medicine | Admitting: Internal Medicine

## 2023-12-12 DIAGNOSIS — Z1231 Encounter for screening mammogram for malignant neoplasm of breast: Secondary | ICD-10-CM

## 2023-12-19 ENCOUNTER — Other Ambulatory Visit: Payer: Self-pay | Admitting: Vascular Surgery

## 2023-12-19 DIAGNOSIS — M7989 Other specified soft tissue disorders: Secondary | ICD-10-CM

## 2023-12-21 DIAGNOSIS — M810 Age-related osteoporosis without current pathological fracture: Secondary | ICD-10-CM | POA: Diagnosis not present

## 2023-12-21 DIAGNOSIS — I10 Essential (primary) hypertension: Secondary | ICD-10-CM | POA: Diagnosis not present

## 2023-12-21 DIAGNOSIS — F411 Generalized anxiety disorder: Secondary | ICD-10-CM | POA: Diagnosis not present

## 2023-12-21 DIAGNOSIS — F4321 Adjustment disorder with depressed mood: Secondary | ICD-10-CM | POA: Diagnosis not present

## 2024-01-29 ENCOUNTER — Ambulatory Visit: Admitting: Physician Assistant

## 2024-01-29 ENCOUNTER — Ambulatory Visit (HOSPITAL_COMMUNITY)
Admission: RE | Admit: 2024-01-29 | Discharge: 2024-01-29 | Disposition: A | Discharge status: Home / Self Care | Source: Ambulatory Visit | Attending: Vascular Surgery | Admitting: Vascular Surgery

## 2024-01-29 VITALS — BP 114/79 | HR 82 | Temp 97.7°F | Wt 188.0 lb

## 2024-01-29 DIAGNOSIS — M7989 Other specified soft tissue disorders: Secondary | ICD-10-CM | POA: Diagnosis not present

## 2024-01-29 DIAGNOSIS — I872 Venous insufficiency (chronic) (peripheral): Secondary | ICD-10-CM

## 2024-01-29 NOTE — Progress Notes (Signed)
 Requested by:  Vernon Velna SAUNDERS, MD [address] [place],  [place] [address]  Reason for consultation: chronic left leg pain and swelling     History of Present Illness   Tiffany Avila is a 68 y.o. (1955-06-25) female who presents for evaluation of left lower extremity swelling and spider veins. She explains that she had stripping x2 in her left leg back in the 90's. She feels like more recently her symptoms started to come back. Swelling, Aching and visible spider veins behind her left knee. She does not wear compression stockings or elevate. She says she use to walk several miles daily but she lost her sister about 1 year ago and has not be able to get back to walking since. She admits she needs to get back into exercise. She works as a Customer Service Manager so her job is a mix of being at at desk and up on her feet. She says her mother had a DVT but she passed away about 14 years ago. No history of venous disease. No personal history of DVT.   Venous symptoms include: aching, swelling Onset/duration:  < 1 year  Occupation:  [employer] Account Manager Aggravating factors: sitting, standing Alleviating factors: none Compression:  no Helps:  n/a Pain medications:  none Previous vein procedures:  left GSV stripping in the 90's History of DVT:  No  Past Medical History:  Diagnosis Date   Hypertension    MVP (mitral valve prolapse) mid 80's   no dental prophylaxis    Past Surgical History:  Procedure Laterality Date   BREAST ENHANCEMENT SURGERY  2000   UMBILICAL HERNIA REPAIR N/A 02/23/2020   Procedure: PRIMARY REPAIR INCARCERATED EPIGASTRIC HERNIA;  Surgeon: Kallie Manuelita BROCKS, MD;  Location: [facility];  Service: General;  Laterality: N/A;    Social History   Socioeconomic History   Marital status: Divorced    Spouse name: Not on file   Number of children: Not on file   Years of education: Not on file   Highest education level: Not on file  Occupational History   Not on  file  Tobacco Use   Smoking status: Never   Smokeless tobacco: Never  Substance and Sexual Activity   Alcohol use: No   Drug use: No   Sexual activity: Yes    Partners: Male    Birth control/protection: None  Other Topics Concern   Not on file  Social History Narrative   Not on file   Social Drivers of Health   Financial Resource Strain: Not on file  Food Insecurity: Not on file  Transportation Needs: Not on file  Physical Activity: Not on file  Stress: Not on file  Social Connections: Not on file  Intimate Partner Violence: Not on file    Family History  Problem Relation Age of Onset   Lupus Mother    Heart disease Father    Bladder Cancer Father 47   Lung cancer Father    Breast cancer Sister 35   Cancer - Other Brother 2       rhabnosasrcoma of inner ear   Hypertension Maternal Grandmother    Hypertension Maternal Grandfather    Hypertension Paternal Grandmother    Hypertension Paternal Grandfather     Current Outpatient Medications  Medication Sig Dispense Refill   buPROPion (WELLBUTRIN XL) 150 MG 24 hr tablet Take 1 tablet by mouth daily.     estradiol  (ESTRACE ) 0.5 MG tablet Take 1 tablet (0.5 mg  total) by mouth daily. 90 tablet 4   fluticasone  (FLONASE ) 50 MCG/ACT nasal spray Place 2 sprays into both nostrils daily. 16 g 6   gabapentin  (NEURONTIN ) 100 MG capsule Take 1 capsule (100 mg total) by mouth 3 (three) times daily. 60 capsule 0   lisinopril (ZESTRIL) 5 MG tablet Take 5 mg by mouth 2 (two) times daily.     medroxyPROGESTERone  (PROVERA ) 2.5 MG tablet Take 1 tablet (2.5 mg total) by mouth daily. 90 tablet 4   UNABLE TO FIND daily. Icool     VALTREX 500 MG tablet Take 500 mg by mouth 2 (two) times daily.     venlafaxine  XR (EFFEXOR  XR) 37.5 MG 24 hr capsule Take 1 capsule (37.5 mg total) by mouth daily. 90 capsule 3   No current facility-administered medications for this visit.    No Known Allergies  REVIEW OF SYSTEMS (negative unless checked):    Cardiac:  []  Chest pain or chest pressure? []  Shortness of breath upon activity? []  Shortness of breath when lying flat? []  Irregular heart rhythm?  Vascular:  []  Pain in calf, thigh, or hip brought on by walking? []  Pain in feet at night that wakes you up from your sleep? []  Blood clot in your veins? [x]  Leg swelling?  Pulmonary:  []  Oxygen at home? []  Productive cough? []  Wheezing?  Neurologic:  []  Sudden weakness in arms or legs? []  Sudden numbness in arms or legs? []  Sudden onset of difficult speaking or slurred speech? []  Temporary loss of vision in one eye? []  Problems with dizziness?  Gastrointestinal:  []  Blood in stool? []  Vomited blood?  Genitourinary:  []  Burning when urinating? []  Blood in urine?  Psychiatric:  []  Major depression  Hematologic:  []  Bleeding problems? []  Problems with blood clotting?  Dermatologic:  []  Rashes or ulcers?  Constitutional:  []  Fever or chills?  Ear/Nose/Throat:  []  Change in hearing? []  Nose bleeds? []  Sore throat?  Musculoskeletal:  []  Back pain? []  Joint pain? []  Muscle pain?   Physical Examination     Vitals:   01/29/24 1036  BP: 114/79  Pulse: 82  Temp: 97.7 F (36.5 C)  TempSrc: Temporal  Weight: 188 lb (85.3 kg)   Body mass index is 29.44 kg/m.  General:  WDWN in NAD; vital signs documented above Gait: Normal HENT: WNL, normocephalic Pulmonary: normal non-labored breathing Cardiac: regular HR Extremities: without varicose veins, with reticular veins in left popliteal fossa, without edema, without stasis pigmentation, without lipodermatosclerosis, without ulcers Musculoskeletal: no muscle wasting or atrophy  Neurologic: A&O X 3;  No focal weakness or paresthesias are detected Psychiatric:  The pt has Normal affect.  Non-invasive Vascular Imaging   BLE Venous Insufficiency Duplex (01/29/24):  LLE: No DVT and SVT GSV reflux- absent GSV diameter - absent SSV reflux at [facility] to distal  calf CFV, FV, popliteal deep venous reflux   Medical Decision Making   Tiffany Avila is a 68 y.o. female who presents with: LLE chronic venous insufficiency with swelling Duplex shows no DVT or SVT. She does not have a GSV from prior history of stripping. She has significant deep reflux and some superficial reflux in her SSV. Her small saphenous is too small to be ablated. Based on her duplex she is not a candidate for any venous intervention. She does have some reticular veins. She would like these treated. We discussed option for sclerotherapy and I provided her with Inocente Row RN's business card. She can call to schedule at  her own convenience Based on the patient's history and examination, I recommend: daily elevation of 20-30 minutes above level of heart, daily compression stocking use, exercise, weight reduction, refraining from prolonged sitting or standing. I discussed with the patient the use of her 15-20 mm knee high compression stockings Patient was provided with information about vein health She can follow up as needed    Tiffany Damme, PA-C [facility] Office: [phone]  01/29/2024, 11:10 AM  Clinic MD: [place]
